# Patient Record
Sex: Male | Born: 1985 | Race: White | Hispanic: No | Marital: Single | Smoking: Former smoker
Health system: Southern US, Community
[De-identification: ages and names within clinical notes are randomized; demographics above are authoritative.]

## PROBLEM LIST (undated history)

## (undated) DIAGNOSIS — J039 Acute tonsillitis, unspecified: Secondary | ICD-10-CM

## (undated) DIAGNOSIS — F909 Attention-deficit hyperactivity disorder, unspecified type: Secondary | ICD-10-CM

## (undated) HISTORY — DX: Attention-deficit hyperactivity disorder, unspecified type: F90.9

---
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
## 2011-09-03 ENCOUNTER — Emergency Department: Payer: Self-pay | Admitting: Emergency Medicine

## 2012-09-04 DIAGNOSIS — J039 Acute tonsillitis, unspecified: Secondary | ICD-10-CM

## 2012-09-04 HISTORY — DX: Acute tonsillitis, unspecified: J03.90

## 2014-04-26 ENCOUNTER — Emergency Department (HOSPITAL_COMMUNITY)
Admission: EM | Admit: 2014-04-26 | Discharge: 2014-04-26 | Disposition: A | Discharge status: Home / Self Care | Payer: Medicaid Other | Attending: Emergency Medicine | Admitting: Emergency Medicine

## 2014-04-26 ENCOUNTER — Emergency Department (HOSPITAL_COMMUNITY): Payer: Medicaid Other

## 2014-04-26 ENCOUNTER — Encounter (HOSPITAL_COMMUNITY): Payer: Self-pay | Admitting: Emergency Medicine

## 2014-04-26 DIAGNOSIS — R51 Headache: Secondary | ICD-10-CM | POA: Insufficient documentation

## 2014-04-26 DIAGNOSIS — J029 Acute pharyngitis, unspecified: Secondary | ICD-10-CM | POA: Insufficient documentation

## 2014-04-26 DIAGNOSIS — J039 Acute tonsillitis, unspecified: Secondary | ICD-10-CM | POA: Insufficient documentation

## 2014-04-26 HISTORY — DX: Acute tonsillitis, unspecified: J03.90

## 2014-04-26 LAB — RAPID STREP SCREEN (MED CTR MEBANE ONLY): Streptococcus, Group A Screen (Direct): NEGATIVE

## 2014-04-26 MED ORDER — ACETAMINOPHEN-CODEINE #3 300-30 MG PO TABS: 1.0000 | ORAL_TABLET | Freq: Four times a day (QID) | ORAL | Status: DC | PRN

## 2014-04-26 MED ORDER — CLINDAMYCIN HCL 150 MG PO CAPS
600.0000 mg | ORAL_CAPSULE | Freq: Once | ORAL | Status: AC
  Administered 2014-04-26: 600 mg via ORAL
  Filled 2014-04-26: qty 4

## 2014-04-26 MED ORDER — IOHEXOL 300 MG/ML  SOLN
75.0000 mL | Freq: Once | INTRAMUSCULAR | Status: AC | PRN
  Administered 2014-04-26: 75 mL via INTRAVENOUS

## 2014-04-26 MED ORDER — DEXAMETHASONE SODIUM PHOSPHATE 10 MG/ML IJ SOLN
10.0000 mg | Freq: Once | INTRAMUSCULAR | Status: AC
  Administered 2014-04-26: 10 mg via INTRAVENOUS
  Filled 2014-04-26: qty 1

## 2014-04-26 MED ORDER — CLINDAMYCIN HCL 150 MG PO CAPS: 450.0000 mg | ORAL_CAPSULE | Freq: Three times a day (TID) | ORAL | Status: DC

## 2014-04-26 MED ORDER — HYDROCODONE-ACETAMINOPHEN 5-325 MG PO TABS
1.0000 | ORAL_TABLET | Freq: Once | ORAL | Status: AC
  Administered 2014-04-26: 1 via ORAL
  Filled 2014-04-26: qty 1

## 2014-04-26 NOTE — Discharge Instructions (Signed)
1. Medications: Clindamycin, Tylenol #3, usual home medications 2. Treatment: rest, drink plenty of fluids, complete antibiotic course 3. Follow Up: Please followup with your primary doctor and ear nose and throat for discussion of your diagnoses and further evaluation after today's visit; if you do not have a primary care doctor use the resource guide provided to find one;     Tonsillitis Tonsillitis is an infection of the throat that causes the tonsils to become red, tender, and swollen. Tonsils are collections of lymphoid tissue at the back of the throat. Each tonsil has crevices (crypts). Tonsils help fight nose and throat infections and keep infection from spreading to other parts of the body for the first 18 months of life.  CAUSES Sudden (acute) tonsillitis is usually caused by infection with streptococcal bacteria. Long-lasting (chronic) tonsillitis occurs when the crypts of the tonsils become filled with pieces of food and bacteria, which makes it easy for the tonsils to become repeatedly infected. SYMPTOMS  Symptoms of tonsillitis include:  A sore throat, with possible difficulty swallowing.  White patches on the tonsils.  Fever.  Tiredness.  New episodes of snoring during sleep, when you did not snore before.  Small, foul-smelling, yellowish-white pieces of material (tonsilloliths) that you occasionally cough up or spit out. The tonsilloliths can also cause you to have bad breath. DIAGNOSIS Tonsillitis can be diagnosed through a physical exam. Diagnosis can be confirmed with the results of lab tests, including a throat culture. TREATMENT  The goals of tonsillitis treatment include the reduction of the severity and duration of symptoms and prevention of associated conditions. Symptoms of tonsillitis can be improved with the use of steroids to reduce the swelling. Tonsillitis caused by bacteria can be treated with antibiotic medicines. Usually, treatment with antibiotic medicines  is started before the cause of the tonsillitis is known. However, if it is determined that the cause is not bacterial, antibiotic medicines will not treat the tonsillitis. If attacks of tonsillitis are severe and frequent, your health care provider may recommend surgery to remove the tonsils (tonsillectomy). HOME CARE INSTRUCTIONS   Rest as much as possible and get plenty of sleep.  Drink plenty of fluids. While the throat is very sore, eat soft foods or liquids, such as sherbet, soups, or instant breakfast drinks.  Eat frozen ice pops.  Gargle with a warm or cold liquid to help soothe the throat. Mix 1/4 teaspoon of salt and 1/4 teaspoon of baking soda in 8 oz of water. SEEK MEDICAL CARE IF:   Large, tender lumps develop in your neck.  A rash develops.  A green, yellow-brown, or bloody substance is coughed up.  You are unable to swallow liquids or food for 24 hours.  You notice that only one of the tonsils is swollen. SEEK IMMEDIATE MEDICAL CARE IF:   You develop any new symptoms such as vomiting, severe headache, stiff neck, chest pain, or trouble breathing or swallowing.  You have severe throat pain along with drooling or voice changes.  You have severe pain, unrelieved with recommended medications.  You are unable to fully open the mouth.  You develop redness, swelling, or severe pain anywhere in the neck.  You have a fever. MAKE SURE YOU:   Understand these instructions.  Will watch your condition.  Will get help right away if you are not doing well or get worse. Document Released: 05/31/2005 Document Revised: 01/05/2014 Document Reviewed: 02/07/2013 [facility] Patient Information 2015 [place], [place]. This information is not intended to replace advice  given to you by your health care provider. Make sure you discuss any questions you have with your health care provider.    Emergency Department Resource Guide 1) Find a Doctor and Pay Out of Pocket Although you won't  have to find out who is covered by your insurance plan, it is a good idea to ask around and get recommendations. You will then need to call the office and see if the doctor you have chosen will accept you as a new patient and what types of options they offer for patients who are self-pay. Some doctors offer discounts or will set up payment plans for their patients who do not have insurance, but you will need to ask so you aren't surprised when you get to your appointment.  2) Contact Your Local Health Department Not all health departments have doctors that can see patients for sick visits, but many do, so it is worth a call to see if yours does. If you don't know where your local health department is, you can check in your phone book. The CDC also has a tool to help you locate your state's health department, and many state websites also have listings of all of their local health departments.  3) Find a Walk-in Clinic If your illness is not likely to be very severe or complicated, you may want to try a walk in clinic. These are popping up all over the country in pharmacies, drugstores, and shopping centers. They're usually staffed by nurse practitioners or physician assistants that have been trained to treat common illnesses and complaints. They're usually fairly quick and inexpensive. However, if you have serious medical issues or chronic medical problems, these are probably not your best option.  No Primary Care Doctor: - Call Health Connect at  [phone] - they can help you locate a primary care doctor that  accepts your insurance, provides certain services, etc. - Physician Referral Service- [phone]  Chronic Pain Problems: Organization         Address  Phone   Notes  [facility]  [phone] Patients need to be referred by their primary care doctor.   Medication Assistance: Organization         Address  Phone   Notes  [facility] Medication [facility]  [address] [place] [place], [place] [address] [phone] --Must be a resident of [facility] -- Must have NO insurance coverage whatsoever (no Medicaid/ Medicare, etc.) -- The pt. MUST have a primary care doctor that directs their care regularly and follows them in the community   [facility]  [phone]   [employer]  [phone]    Agencies that provide inexpensive medical care: Organization         Address  Phone   Notes  [facility]  [phone]   [facility]    [phone]   [facility] [address] [place], [place] [address] [phone]   [facility] [address] [place], [place] [phone]   Planned Parenthood    [phone]   [facility]    [phone]   [facility]  [address], [place] Phone:  [phone], Fax:  [phone] Hours of Operation:  9 am - 6 pm, M-F.  Also accepts Medicaid/Medicare and self-pay.  [facility]  [address], [place] Phone: [phone])  [phone], Fax: [phone]. Hours of Operation:  8:30 am - 5:30 pm, M-F.  Also accepts Medicaid and self-pay.  [facility] [place] [address] [place] Phone: [phone]   [facility] [address] Natasha Bence [place], [place] [phone], Ext. [phone] Mondays & Thursdays: 7-9 AM.  First 15 patients are seen on a first come, first serve basis.    Medicaid-accepting [facility] Department Providers:  Organization         Address  Phone   Notes  [facility] [address], [place] [phone] Also accepts self-pay patients.  [facility] [address] Laurell Josephs [place], [place]  [phone]   [facility] [address], [place] [phone]   [facility] [address] [place] [phone]     Renaye Rakers [address], [place]   [phone] Only accepts [place] Access [place] patients after they have their name applied to their card.   Self-Pay (no insurance) in [facility]:  Organization         Address  Phone   Notes  Sickle Cell Patients, [facility] [address] [place], [place] [phone]   [facility] [address] [place], [place] [phone]   [facility] [place]  [address], [place] [phone]   [facility]/Dr. Osei-Bonsu  [address], [place] or [address], [place] [phone] Phone number for both [place] and [place] locations is the same.  [facility] and [facility] [address] [place] [phone]   [facility] [address] [place] or [address] [phone]   [facility] [address], [place] [phone], phone; [phone], fax Sees patients 1st and 3rd Saturday of every month.  Must not qualify for public or private insurance (i.e. Medicaid, Medicare, [place] Health Choice, Veterans' Benefits)  Household income should be no more than 200% of the poverty level The clinic cannot treat you if you are pregnant or think you are pregnant  Sexually transmitted diseases are not treated at the clinic.    Dental Care: Organization         Address  Phone  Notes  [facility] Department of [facility] [address] [place], [place] [phone] Accepts children up to age 50 who are enrolled in [place] or [place] Health Choice; pregnant women with a Medicaid card; and children who have applied for Medicaid or [place] Health Choice, but were declined, whose parents can pay a reduced fee at time of service.  [facility] of [facility]  [address], [place] [phone] Accepts children  up to age 68 who are enrolled in [place] or [place] Health Choice; pregnant women with a Medicaid card; and children who have applied for Medicaid or  Health Choice, but were declined, whose parents can pay a reduced fee at time of service.  [facility]  [address] [place], [place] [phone] Patients are seen by appointment only. Walk-ins are not accepted. [facility] will see patients 63 years of age and older. Monday - Tuesday (8am-5pm) Most Wednesdays (8:30-5pm) $30 per visit, cash only  [facility]  [address],  [place] [phone] Patients are seen by appointment only. Walk-ins are not accepted. [facility] will see patients 106 years of age and older. One Wednesday Evening (Monthly: Volunteer Based).  $30 per visit, cash only  [employer]  [phone] for adults; Children under age 51, call Graduate Pediatric Dentistry at [phone]. Children aged 17-14, please call [phone] to request a pediatric application.  Dental services are provided in all areas of dental care including fillings, crowns and bridges, complete and partial dentures, implants, gum treatment, root canals, and extractions. Preventive care is also provided. Treatment is provided to both adults and children. Patients are selected via a lottery and there is often a waiting list.   [facility] [address], [place]  [phone] [email or web address]   [facility] [address] [place], [place] [phone], Ext. [phone] Second and Fourth Thursday of each month, opens at 6:30 AM; Clinic ends at 9 AM.  Patients are seen on a first-come first-served basis, and a limited number are seen during each clinic.   [facility]  [address] Ether Griffins [place], [place] [phone]   Eligibility Requirements You must have lived in [place], [place], or [place] counties for at least the last  three months.   You cannot be eligible for state or federal sponsored [place], including [employer], [place], or [employer].   You generally cannot be eligible for healthcare insurance through your employer.    How to apply: Eligibility screenings are held every Tuesday and Wednesday afternoon from 1:00 pm until 4:00 pm. You do not need an appointment for the interview!  [facility] [address], [place], [place] [phone]   [facility] Health Department  [phone]   [facility] Health Department  [phone]   [facility] Health Department  [phone]    Behavioral Health Resources in the Community: Intensive Outpatient Programs Organization         Address  Phone  Notes  [facility] [address] [place], [place] [phone]   [facility] Outpatient [address] [place], [place] [phone]   [facility] [address], [place], [place]  [phone]   [facility] Mental Health [address]  [place], [place] [phone] or [phone]   Substance Abuse Resources Organization         Address  Phone  Notes  [facility]  [phone]   [facility]  [phone]   [facility]  [phone]   Floydene Flock  [phone]   Residential & Outpatient Substance Abuse Program  [phone]   Psychological Services Organization         Address  Phone  Notes  [facility] Behavior[facility]  [phone]   [facility]  [phone]   [facility] Mental Health [address] [place] [phone] or [phone]    Mobile Crisis Teams Organization         Address  Phone  Notes  [facility], Mobile Crisis Care Unit  [phone]   [facility]  [address] [place], [place] [phone]   Doristine Locks [address] [place] [phone]     Self-Help/Support Groups Organization         Address  Phone             Notes  [facility] [place] - variety of support  groups  [phone]- [phone] Call for more information  Narcotics Anonymous (NA), [facility] [address], [employer] [place]  2 meetings at this location   Residential Sports administrator         Address  Phone  Notes  [facility] [address],    [place]  [phone]   [facility]  [address], [place] [address], [place], [place] [phone]   [facility] [address] [place], [place] [phone] Admissions: 8am-3pm M-F  [facility] [address]    [place], [place] [phone]   [facility] [address] [place], [place], [place] [phone]   [facility] [address],  [place], [place] [phone]   [facility] - Intensive Outpatient [address] Laurell Joseph[address], [place], [place] [phone]   [facility] ([facility] [address] [place]  [place], [place] [phone] or [phone]   [facility] ([facility]) [address] [place], [place] [phone] Accepts Medicaid  [facility] [place] [address]  [place] [phone] Substance Abuse/Addiction Treatment   [facility] Organization         Address  Phone  Notes  [facility]  [phone]   Angie Fava, PhD [address] Ervin Knack [place], [place]   [phone] or [phone]   [facility] Behavioral   [address] [place], [place] [phone]   [facility] [address], [place], [place] [phone] Insurance/Medicaid/sponsorship through [facility] and Families [address]                                    [place], [place] [phone] Therapy/tele-psych/case  [facility] [address], [place] [phone]    Dr. Lolly Mustache  [phone]   Free  [facility] [place]  [employer] [facility] Dept. 1) [address], [place] 2) [address], [place] 3)  [address], [place] [phone]  [phone]   [facility] Child Abuse Hotline [phone] or [phone] (After Hours)

## 2014-04-26 NOTE — ED Notes (Signed)
He states hes had a sore throat and a headache x 2 days.

## 2014-04-26 NOTE — ED Provider Notes (Signed)
CSN: [id number]     Arrival date & time 04/26/14  6213 History  This chart was scribed for non-physician practitioner Dalia Heading, PA-C, working with Elwin Mocha, MD, by Yevette Edwards, ED Scribe. This patient was seen in room TR07C/TR07C and the patient's care was started at 7:32 PM.   None    Chief Complaint  Patient presents with  . Sore Throat    The history is provided by the patient. No language interpreter was used.   HPI Comments: Clifford Clay is a 28 y.o. male who presents to the Emergency Department complaining of a sore throat which has worsened in the past two days. He endorses a h/o tonsillitis for two years stating his left tonsil is chronically swollen. However, he reports the left tonsil has increased in swelling in the past two days and has been associated with pain. He reports a subjective fever and chills; his temperature in the ED is 98.1 F. He also endorses chronic headaches which he attributes to his wisdom teeth. He has used 800 mg IBU with moderate relief. Mr. Bozman states he was treated for tonsillitis a year ago, but he has not followed up with a ENT.  Past Medical History  Diagnosis Date  . Tonsillitis    History reviewed. No pertinent past surgical history. History reviewed. No pertinent family history. History  Substance Use Topics  . Smoking status: Never Smoker   . Smokeless tobacco: Not on file  . Alcohol Use: Yes     Comment: rare    Review of Systems  Constitutional: Positive for fever and chills. Negative for diaphoresis, appetite change, fatigue and unexpected weight change.  HENT: Positive for sore throat. Negative for mouth sores.   Eyes: Negative for visual disturbance.  Respiratory: Negative for cough, chest tightness, shortness of breath and wheezing.   Cardiovascular: Negative for chest pain.  Gastrointestinal: Negative for nausea, vomiting, abdominal pain, diarrhea and constipation.  Endocrine: Negative for polydipsia,  polyphagia and polyuria.  Genitourinary: Negative for dysuria, urgency, frequency and hematuria.  Musculoskeletal: Negative for back pain and neck stiffness.  Skin: Negative for rash.  Allergic/Immunologic: Negative for immunocompromised state.  Neurological: Positive for headaches. Negative for syncope and light-headedness.  Hematological: Does not bruise/bleed easily.  Psychiatric/Behavioral: Negative for sleep disturbance. The patient is not nervous/anxious.     Allergies  Review of patient's allergies indicates no known allergies.  Home Medications   Prior to Admission medications   Medication Sig Start Date End Date Taking? Authorizing Provider  acetaminophen-codeine (TYLENOL #3) 300-30 MG per tablet Take 1-2 tablets by mouth every 6 (six) hours as needed for moderate pain. 04/26/14   Tawsha Terrero, PA-C  clindamycin (CLEOCIN) 150 MG capsule Take 3 capsules (450 mg total) by mouth 3 (three) times daily. 04/26/14   Tyisha Cressy, PA-C   Triage Vitals: BP 137/83  Pulse 72  Temp(Src) 98.1 F (36.7 C) (Oral)  Resp 16  Ht  (1.778 m)  Wt 210 lb (95.255 kg)  BMI 30.13 kg/m2  SpO2 99%  Physical Exam  Nursing note and vitals reviewed. Constitutional: He appears well-developed and well-nourished. No distress.  HENT:  Head: Normocephalic and atraumatic.  Right Ear: Tympanic membrane, external ear and ear canal normal.  Left Ear: Tympanic membrane, external ear and ear canal normal.  Nose: Nose normal. No mucosal edema or rhinorrhea.  Mouth/Throat: Uvula is midline and mucous membranes are normal. Mucous membranes are not dry. No trismus in the jaw. No uvula swelling. Oropharyngeal exudate, posterior  oropharyngeal edema and posterior oropharyngeal erythema present. No tonsillar abscesses.  Posterior oropharynx with erythema, edema and exudate on the left tonsil alone, right tonsil normal   Eyes: Conjunctivae are normal.  Neck: Normal range of motion, full passive  range of motion without pain and phonation normal. No tracheal tenderness, no spinous process tenderness and no muscular tenderness present. No rigidity. No erythema and normal range of motion present. No Brudzinski's sign and no Kernig's sign noted.  Range of motion without pain no No midline or paraspinal tenderness Normal phonation No stridor Handling secretions without difficulty No nuchal rigidity or meningeal signs  Cardiovascular: Normal rate, regular rhythm and normal heart sounds.   Pulses:      Radial pulses are 2+ on the right side, and 2+ on the left side.  Pulmonary/Chest: Effort normal and breath sounds normal. No stridor. No respiratory distress. He has no decreased breath sounds. He has no wheezes.  Equal chest expansion, clear and equal breath sounds without focal wheezes, rhonchi or rales  Musculoskeletal: Normal range of motion.  Lymphadenopathy:       Head (right side): No submental, no submandibular, no tonsillar, no preauricular, no posterior auricular and no occipital adenopathy present.       Head (left side): Submandibular and tonsillar adenopathy present. No submental, no preauricular, no posterior auricular and no occipital adenopathy present.    He has cervical adenopathy (left superficial).       Right cervical: No superficial cervical, no deep cervical and no posterior cervical adenopathy present.      Left cervical: Superficial cervical adenopathy present. No deep cervical and no posterior cervical adenopathy present.  Neurological: He is alert.  Alert and oriented Moves all extremities without ataxia  Skin: Skin is warm and dry. He is not diaphoretic.  Psychiatric: He has a normal mood and affect.    ED Course  Procedures (including critical care time)  DIAGNOSTIC STUDIES: Oxygen Saturation is 99% on room air, normal by my interpretation.    COORDINATION OF CARE:  7:37 PM- Discussed treatment plan with patient, and the patient agreed to the plan. The  plan includes antibiotics and a CT scan.   7:57 PM- Dr. Gwendolyn Grant assessed the pt and concurred with ordering a CT scan.    Labs Review Labs Reviewed  RAPID STREP SCREEN  CULTURE, GROUP A STREP    Imaging Review Ct Soft Tissue Neck W Contrast  04/26/2014   CLINICAL DATA:  Sore throat  EXAM: CT NECK WITH CONTRAST  TECHNIQUE: Multidetector CT imaging of the neck was performed using the standard protocol following the bolus administration of intravenous contrast.  CONTRAST:  75mL OMNIPAQUE IOHEXOL 300 MG/ML  SOLN  COMPARISON:  None.  FINDINGS: There is tonsillar enlargement, particularly on the left. This tonsillar enlargement on the left is causing localized narrowing of the airway at the tongue base. There is no well-defined abscess in the tonsil/peritonsillar regions.  Varix appears within normal limits. The epiglottis appears normal. The area epiglottic folds are borderline thickened without focal mass or abscess. There is no surrounding edema in this area.  The tongue appears normal.  There are multiple small lymph nodes throughout the neck. There is an enlarged lymph node between the left sublingual gland and sternocleidomastoid muscle, measuring 1.6 x 1.3 cm. There is a second lymph node slightly inferiorly on the left just posterior to the jugular vein and deep to the sternocleidomastoid muscle measuring 1.6 x 1.0 cm. By size criteria, no other lymph node  enlargement is seen.  Thyroid appears normal.  There is no upper mediastinal adenopathy. Visualized lungs are clear.  Salivary glands appear within normal limits bilaterally. There are no bony lesions appreciable on this study. Visualized paranasal sinuses and mastoids appear clear. Visualized brain parenchyma is normal. No vascular lesions are appreciable.  IMPRESSION: Peritonsillar enlargement on the left causing some focal airway narrowing at the tongue base level. There is adenopathy on the left deep to the sternocleidomastoid muscle, likely  secondary to inflammation from the peritonsillar regions. No well-defined abscess.  Borderline thickening of the area epiglottic folds without edema or mass. No abscess.  Multiple smaller lymph nodes are noted in both deep cervical node chains and in the tongue base regions bilaterally.  No bony abnormality. Visualized paranasal sinuses and mastoids appear clear.   Electronically Signed   By: Bretta Bang M.D.   On: 04/26/2014 20:38     EKG Interpretation None      MDM   Final diagnoses:  Tonsillitis   Graeden Bitner presents with a history of chronic tonsillitis with increasing pain and swelling of the left tonsil with purulent pockets noted in the last several days. Concern for possible peritonsillar abscess. We'll obtain CT neck.  9:25 PM CT neck with peritonsillar enlargement on the left with adenopathy but no abscess.  Patient given dose of clindamycin here in the emergency department and will be discharged home with same. Discussed need for close followup with ear nose and throat physician within one week. Also given resources to find primary care physician.  Patient is alert, oriented, nontoxic nonseptic appearing. No systemic symptoms. Patient with patent airway, no stridor and he handling secretions and by mouth without difficulty.  Patient is return to emergency department if he develops high fevers, difficulty breathing or inability to swallow.   BP 132/88  Pulse 62  Temp(Src) 98.7 F (37.1 C) (Oral)  Resp 16  Ht  (1.778 m)  Wt 210 lb (95.255 kg)  BMI 30.13 kg/m2  SpO2 99%  The patient was discussed with and seen by Dr. Gwendolyn Grant who agrees with the treatment plan.  I personally performed the services described in this documentation, which was scribed in my presence. The recorded information has been reviewed and is accurate.   Dahlia Client Ranell Skibinski, PA-C 04/26/14 2127  Dierdre Forth, PA-C 04/26/14 2127

## 2014-04-28 LAB — CULTURE, GROUP A STREP

## 2014-04-30 NOTE — ED Provider Notes (Signed)
Medical screening examination/treatment/procedure(s) were conducted as a shared visit with non-physician practitioner(s) and myself.  I personally evaluated the patient during the encounter.   EKG Interpretation None      Patient with hx of chronic tonsillitis, L swollen tonsil on exam. Sore throat and afebrile here. L tonsil with exudates and moderate swelling. Uvula midline. CT scan without abscess. Abx, discharge.  Elwin Mocha, MD 04/30/14 [id number]

## 2014-05-01 ENCOUNTER — Ambulatory Visit: Payer: Self-pay | Attending: Family Medicine | Admitting: Family Medicine

## 2014-05-01 ENCOUNTER — Encounter: Payer: Self-pay | Admitting: Family Medicine

## 2014-05-01 VITALS — BP 113/78 | HR 73 | Temp 98.1°F | Resp 16 | Ht 70.5 in | Wt 219.0 lb

## 2014-05-01 DIAGNOSIS — Z113 Encounter for screening for infections with a predominantly sexual mode of transmission: Secondary | ICD-10-CM

## 2014-05-01 DIAGNOSIS — F17200 Nicotine dependence, unspecified, uncomplicated: Secondary | ICD-10-CM | POA: Insufficient documentation

## 2014-05-01 DIAGNOSIS — Z1159 Encounter for screening for other viral diseases: Secondary | ICD-10-CM | POA: Insufficient documentation

## 2014-05-01 DIAGNOSIS — F17210 Nicotine dependence, cigarettes, uncomplicated: Secondary | ICD-10-CM

## 2014-05-01 DIAGNOSIS — Z833 Family history of diabetes mellitus: Secondary | ICD-10-CM | POA: Insufficient documentation

## 2014-05-01 DIAGNOSIS — J039 Acute tonsillitis, unspecified: Secondary | ICD-10-CM | POA: Insufficient documentation

## 2014-05-01 LAB — POCT GLYCOSYLATED HEMOGLOBIN (HGB A1C): Hemoglobin A1C: 5.3

## 2014-05-01 NOTE — Patient Instructions (Signed)
Clifford Clay,  Thank you for coming in today. It was a pleasure meeting and talking with you. I look forward to being your primary doctor.   Regarding your recurrent tonsillitis over the past year, and his referral to ENT. He should fill the paperwork for financial assistance. In the meantime failure prescription for clindamycin and Tylenol No. 3 to have as needed. If he had recurrence of those that are severe associated with fever please schedule an appointment for followup with me.  Remember to come next month for a flu shot.  You will be called with the results of your screening study.  Dr. Armen Pickup

## 2014-05-01 NOTE — Assessment & Plan Note (Signed)
A: recurrent tonsillitis on the L sided for the past year. Most likely viral.  P: Referral to ENT Patient to fill rx for clinda and tylenol #3 to have as needed

## 2014-05-01 NOTE — Assessment & Plan Note (Signed)
Screening HIV  

## 2014-05-01 NOTE — Progress Notes (Signed)
Establish Care HFU, Hx tonsillitis pt stated did not stat antibiotic

## 2014-05-01 NOTE — Progress Notes (Signed)
   Subjective:    Patient ID: Clifford Clay, male    DOB: 04-20-1986, 28 y.o.   MRN: [id number] CC: establish care, ED f/u for sore throat  HPI 28 year old male presents to establish care the following:  #1 recurrent tonsillitis: Patient with one-year recurrent tonsillitis is worse on the left side. He reports that he'll intermittently have subjective fevers, sore throat, hoarseness along with swelling and pus formation on his tonsils. The symptoms occur every 4-6 weeks and resolve on their own over the course of 7-10 days. Most recently his symptoms were severe he went to the emergency department on 04/26/14. Negative rapid strep and strep culture CT neck w/o abscess. Since being seen in the ED if had improvement of pain and swelling. He has no recurrent subjective fevers.  Soc hx: THC user, occasional tobacco Review of Systems As per history of present illness    Objective:   Physical Exam BP 113/78  Pulse 73  Temp(Src) 98.1 F (36.7 C) (Oral)  Resp 16  Ht 5' 10.5" (1.791 m)  Wt 219 lb (99.338 kg)  BMI 30.97 kg/m2  SpO2 98% General appearance: alert, cooperative and no distress Eyes: conjunctivae/corneas clear. PERRL, EOM's intact.  Ears: normal TM's and external ear canals both ears Nose: Nares normal. Septum midline. Mucosa normal. No drainage or sinus tenderness. Throat: normal findings: lips normal without lesions, buccal mucosa normal, gums healthy, teeth intact, non-carious, tongue midline and normal and oropharynx pink & moist without lesions or evidence of thrush and abnormal findings: tonsillar hypertrophy asymmetric on the L with multiple crypts  Lungs: clear to auscultation bilaterally Heart: regular rate and rhythm, S1, S2 normal, no murmur, click, rub or gallop   CT neck 04/26/14: Peritonsillar enlargement on the left causing some focal airway  narrowing at the tongue base level. There is adenopathy on the left  deep to the sternocleidomastoid muscle, likely secondary  to  inflammation from the peritonsillar regions. No well-defined  abscess.  Borderline thickening of the area epiglottic folds without edema or  mass. No abscess.  Multiple smaller lymph nodes are noted in both deep cervical node  chains and in the tongue base regions bilaterally.  No bony abnormality. Visualized paranasal sinuses and mastoids  appear clear.     Assessment & Plan:

## 2014-05-01 NOTE — Assessment & Plan Note (Addendum)
Screening A1c   Screening A1c normal.

## 2014-05-02 LAB — HIV ANTIBODY (ROUTINE TESTING W REFLEX): HIV 1&2 Ab, 4th Generation: NONREACTIVE

## 2014-05-05 ENCOUNTER — Telehealth: Payer: Self-pay | Admitting: Family Medicine

## 2014-05-05 ENCOUNTER — Telehealth: Payer: Self-pay | Admitting: *Deleted

## 2014-05-05 NOTE — Telephone Encounter (Signed)
Left voice massage to return call,

## 2014-05-05 NOTE — Telephone Encounter (Signed)
Pt returning call regarding results. Please f/u with pt. °

## 2014-05-05 NOTE — Telephone Encounter (Signed)
Message copied by Dyann Kief on Tue May 05, 2014  9:15 AM ------      Message from: Dessa Phi      Created: Mon May 04, 2014  5:51 PM       Please call patient.      HIV negative ------

## 2014-05-05 NOTE — Telephone Encounter (Signed)
Left message on VM that labs were normal and to return my call if any questions.

## 2014-05-26 ENCOUNTER — Ambulatory Visit: Payer: Self-pay

## 2014-06-29 ENCOUNTER — Ambulatory Visit: Payer: Self-pay | Attending: Family Medicine | Admitting: Family Medicine

## 2014-06-29 ENCOUNTER — Encounter: Payer: Self-pay | Admitting: Family Medicine

## 2014-06-29 VITALS — BP 100/60 | HR 57 | Temp 98.3°F | Resp 18 | Ht 70.0 in | Wt 217.0 lb

## 2014-06-29 DIAGNOSIS — J039 Acute tonsillitis, unspecified: Secondary | ICD-10-CM

## 2014-06-29 DIAGNOSIS — F901 Attention-deficit hyperactivity disorder, predominantly hyperactive type: Secondary | ICD-10-CM

## 2014-06-29 DIAGNOSIS — K625 Hemorrhage of anus and rectum: Secondary | ICD-10-CM

## 2014-06-29 DIAGNOSIS — J0391 Acute recurrent tonsillitis, unspecified: Secondary | ICD-10-CM | POA: Insufficient documentation

## 2014-06-29 DIAGNOSIS — F909 Attention-deficit hyperactivity disorder, unspecified type: Secondary | ICD-10-CM | POA: Insufficient documentation

## 2014-06-29 LAB — HEMOCCULT GUIAC POC 1CARD (OFFICE): FECAL OCCULT BLD: NEGATIVE

## 2014-06-29 NOTE — Progress Notes (Signed)
   Subjective:    Patient ID: Clifford Clay, male    DOB: 01/05/1986, 28 y.o.   MRN: [id number] CC: ADHD  HPI 28 yo M with reported history of ADHD dx at age 89. Reports lack of meaningful relationships outside of family members. Fired from 16 jobs in his life. Had particular difficulty in completing task. Has difficulty focusing on task and completing task. Difficulty remembering instructions. Took a friends prescription adderal x one and improved focused. Admits to some depressed mood. Admits to ETOH and THC use 3-4 days a week.   2. Recurrent tonsillitis: patient denies sore throat currently. Admits to symptoms of L sided soreness and mild exudate last week. Took a few leftover clindamycin. Symptoms resolved. He denies fever. Smoking is a trigger so he decreases smoking when he has symptoms.   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    Review of Systems As per HPI  PHQ-2; negative     Objective:   Physical Exam BP 100/60  Pulse 57  Temp(Src) 98.3 F (36.8 C) (Oral)  Resp 18  Ht 5\' 10"  (1.778 m)  Wt 217 lb (98.431 kg)  BMI 31.14 kg/m2  SpO2 99% General appearance: alert, cooperative and no distress Psychology: speaks in complete sentences, thoughts complete  Throat: mild swelling L tonsils, no exudate       Assessment & Plan:

## 2014-06-29 NOTE — Assessment & Plan Note (Signed)
A: hx of ADHD per report P: List of psychologist provided Patient will need assessment prior to initiating medication therapy.

## 2014-06-29 NOTE — Progress Notes (Signed)
Complaining of sore throat Stated has Hx ADD, not taking medication

## 2014-06-29 NOTE — Assessment & Plan Note (Signed)
A: recurrent tonsillitis P:  Treat prn Patient advised to call in for symptoms and I will treat based on that

## 2014-12-31 ENCOUNTER — Emergency Department (HOSPITAL_COMMUNITY): Payer: Medicaid Other

## 2014-12-31 ENCOUNTER — Encounter (HOSPITAL_COMMUNITY): Payer: Self-pay | Admitting: Emergency Medicine

## 2014-12-31 ENCOUNTER — Emergency Department (HOSPITAL_COMMUNITY)
Admission: EM | Admit: 2014-12-31 | Discharge: 2014-12-31 | Disposition: A | Discharge status: Home / Self Care | Payer: Self-pay | Attending: Emergency Medicine | Admitting: Emergency Medicine

## 2014-12-31 DIAGNOSIS — S01511A Laceration without foreign body of lip, initial encounter: Secondary | ICD-10-CM | POA: Insufficient documentation

## 2014-12-31 DIAGNOSIS — H05231 Hemorrhage of right orbit: Secondary | ICD-10-CM

## 2014-12-31 DIAGNOSIS — Z792 Long term (current) use of antibiotics: Secondary | ICD-10-CM | POA: Insufficient documentation

## 2014-12-31 DIAGNOSIS — Y939 Activity, unspecified: Secondary | ICD-10-CM | POA: Insufficient documentation

## 2014-12-31 DIAGNOSIS — Y929 Unspecified place or not applicable: Secondary | ICD-10-CM | POA: Insufficient documentation

## 2014-12-31 DIAGNOSIS — R42 Dizziness and giddiness: Secondary | ICD-10-CM | POA: Insufficient documentation

## 2014-12-31 DIAGNOSIS — H1131 Conjunctival hemorrhage, right eye: Secondary | ICD-10-CM | POA: Insufficient documentation

## 2014-12-31 DIAGNOSIS — S0990XA Unspecified injury of head, initial encounter: Secondary | ICD-10-CM | POA: Insufficient documentation

## 2014-12-31 DIAGNOSIS — Z8659 Personal history of other mental and behavioral disorders: Secondary | ICD-10-CM | POA: Insufficient documentation

## 2014-12-31 DIAGNOSIS — S022XXA Fracture of nasal bones, initial encounter for closed fracture: Secondary | ICD-10-CM | POA: Insufficient documentation

## 2014-12-31 DIAGNOSIS — Y999 Unspecified external cause status: Secondary | ICD-10-CM | POA: Insufficient documentation

## 2014-12-31 DIAGNOSIS — Z87891 Personal history of nicotine dependence: Secondary | ICD-10-CM | POA: Insufficient documentation

## 2014-12-31 DIAGNOSIS — R040 Epistaxis: Secondary | ICD-10-CM | POA: Insufficient documentation

## 2014-12-31 DIAGNOSIS — S025XXA Fracture of tooth (traumatic), initial encounter for closed fracture: Secondary | ICD-10-CM | POA: Insufficient documentation

## 2014-12-31 LAB — CBC WITH DIFFERENTIAL/PLATELET
Basophils Absolute: 0 10*3/uL (ref 0.0–0.1)
Basophils Relative: 0 % (ref 0–1)
EOS PCT: 0 % (ref 0–5)
Eosinophils Absolute: 0.1 10*3/uL (ref 0.0–0.7)
HEMATOCRIT: 46.1 % (ref 39.0–52.0)
Hemoglobin: 15.8 g/dL (ref 13.0–17.0)
Lymphocytes Relative: 11 % — ABNORMAL LOW (ref 12–46)
Lymphs Abs: 1.7 10*3/uL (ref 0.7–4.0)
MCH: 29.6 pg (ref 26.0–34.0)
MCHC: 34.3 g/dL (ref 30.0–36.0)
MCV: 86.5 fL (ref 78.0–100.0)
MONO ABS: 0.8 10*3/uL (ref 0.1–1.0)
Monocytes Relative: 5 % (ref 3–12)
Neutro Abs: 13.6 10*3/uL — ABNORMAL HIGH (ref 1.7–7.7)
Neutrophils Relative %: 84 % — ABNORMAL HIGH (ref 43–77)
PLATELETS: 372 10*3/uL (ref 150–400)
RBC: 5.33 MIL/uL (ref 4.22–5.81)
RDW: 12.5 % (ref 11.5–15.5)
WBC: 16.1 10*3/uL — ABNORMAL HIGH (ref 4.0–10.5)

## 2014-12-31 LAB — I-STAT CHEM 8, ED
BUN: 6 mg/dL (ref 6–23)
CHLORIDE: 103 mmol/L (ref 96–112)
Calcium, Ion: 1.14 mmol/L (ref 1.12–1.23)
Creatinine, Ser: 1.2 mg/dL (ref 0.50–1.35)
Glucose, Bld: 103 mg/dL — ABNORMAL HIGH (ref 70–99)
HEMATOCRIT: 50 % (ref 39.0–52.0)
HEMOGLOBIN: 17 g/dL (ref 13.0–17.0)
POTASSIUM: 4.2 mmol/L (ref 3.5–5.1)
SODIUM: 141 mmol/L (ref 135–145)
TCO2: 23 mmol/L (ref 0–100)

## 2014-12-31 LAB — RAPID URINE DRUG SCREEN, HOSP PERFORMED
AMPHETAMINES: NOT DETECTED
BENZODIAZEPINES: POSITIVE — AB
Barbiturates: NOT DETECTED
Cocaine: POSITIVE — AB
OPIATES: NOT DETECTED
Tetrahydrocannabinol: POSITIVE — AB

## 2014-12-31 LAB — ETHANOL: ALCOHOL ETHYL (B): 18 mg/dL — AB (ref 0–9)

## 2014-12-31 MED ORDER — TETANUS-DIPHTH-ACELL PERTUSSIS 5-2.5-18.5 LF-MCG/0.5 IM SUSP
0.5000 mL | Freq: Once | INTRAMUSCULAR | Status: AC
  Administered 2014-12-31: 0.5 mL via INTRAMUSCULAR
  Filled 2014-12-31: qty 0.5

## 2014-12-31 MED ORDER — LIDOCAINE HCL 2 % IJ SOLN
10.0000 mL | Freq: Once | INTRAMUSCULAR | Status: AC
  Administered 2014-12-31: 200 mg via INTRADERMAL
  Filled 2014-12-31: qty 20

## 2014-12-31 MED ORDER — TRAMADOL HCL 50 MG PO TABS
50.0000 mg | ORAL_TABLET | Freq: Four times a day (QID) | ORAL | Status: DC | PRN
Start: 2014-12-31 — End: 2015-11-18

## 2014-12-31 MED ORDER — NAPROXEN 500 MG PO TABS: 500.0000 mg | ORAL_TABLET | Freq: Two times a day (BID) | ORAL | Status: DC

## 2014-12-31 MED ORDER — TRAMADOL HCL 50 MG PO TABS
50.0000 mg | ORAL_TABLET | Freq: Once | ORAL | Status: AC
  Administered 2014-12-31: 50 mg via ORAL
  Filled 2014-12-31: qty 1
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
## 2014-12-31 NOTE — ED Notes (Signed)
PT monitored by pulse ox, bp cuff, and 12-lead. 
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
## 2014-12-31 NOTE — ED Notes (Signed)
Pt ambulated down the hall independently 40 plus feet.

## 2014-12-31 NOTE — ED Notes (Addendum)
[employer] at the bedside 

## 2014-12-31 NOTE — Discharge Instructions (Signed)
Naprosyn and tramadol for pain. Rinse your mouth with salt water several times a day especially after eating. Outer suture removal on the lower lip in 5 days. Your inner sutures are dissolvable.  Watch for signs of infection. Ice your face several times a day. We updated your tetanus today. Follow up with a dentist as soon as able for treatment of your dental fracture, see referral above.    Concussion A concussion, or closed-head injury, is a brain injury caused by a direct blow to the head or by a quick and sudden movement (jolt) of the head or neck. Concussions are usually not life-threatening. Even so, the effects of a concussion can be serious. If you have had a concussion before, you are more likely to experience concussion-like symptoms after a direct blow to the head.  CAUSES  Direct blow to the head, such as from running into another player during a soccer game, being hit in a fight, or hitting your head on a hard surface.  A jolt of the head or neck that causes the brain to move back and forth inside the skull, such as in a car crash. SIGNS AND SYMPTOMS The signs of a concussion can be hard to notice. Early on, they may be missed by you, family members, and health care providers. You may look fine but act or feel differently. Symptoms are usually temporary, but they may last for days, weeks, or even longer. Some symptoms may appear right away while others may not show up for hours or days. Every head injury is different. Symptoms include:  Mild to moderate headaches that will not go away.  A feeling of pressure inside your head.  Having more trouble than usual:  Learning or remembering things you have heard.  Answering questions.  Paying attention or concentrating.  Organizing daily tasks.  Making decisions and solving problems.  Slowness in thinking, acting or reacting, speaking, or reading.  Getting lost or being easily confused.  Feeling tired all the time or lacking  energy (fatigued).  Feeling drowsy.  Sleep disturbances.  Sleeping more than usual.  Sleeping less than usual.  Trouble falling asleep.  Trouble sleeping (insomnia).  Loss of balance or feeling lightheaded or dizzy.  Nausea or vomiting.  Numbness or tingling.  Increased sensitivity to:  Sounds.  Lights.  Distractions.  Vision problems or eyes that tire easily.  Diminished sense of taste or smell.  Ringing in the ears.  Mood changes such as feeling sad or anxious.  Becoming easily irritated or angry for little or no reason.  Lack of motivation.  Seeing or hearing things other people do not see or hear (hallucinations). DIAGNOSIS Your health care provider can usually diagnose a concussion based on a description of your injury and symptoms. He or she will ask whether you passed out (lost consciousness) and whether you are having trouble remembering events that happened right before and during your injury. Your evaluation might include:  A brain scan to look for signs of injury to the brain. Even if the test shows no injury, you may still have a concussion.  Blood tests to be sure other problems are not present. TREATMENT  Concussions are usually treated in an emergency department, in urgent care, or at a clinic. You may need to stay in the hospital overnight for further treatment.  Tell your health care provider if you are taking any medicines, including prescription medicines, over-the-counter medicines, and natural remedies. Some medicines, such as blood thinners (anticoagulants)  and aspirin, may increase the chance of complications. Also tell your health care provider whether you have had alcohol or are taking illegal drugs. This information may affect treatment.  Your health care provider will send you home with important instructions to follow.  How fast you will recover from a concussion depends on many factors. These factors include how severe your  concussion is, what part of your brain was injured, your age, and how healthy you were before the concussion.  Most people with mild injuries recover fully. Recovery can take time. In general, recovery is slower in older persons. Also, persons who have had a concussion in the past or have other medical problems may find that it takes longer to recover from their current injury. HOME CARE INSTRUCTIONS General Instructions  Carefully follow the directions your health care provider gave you.  Only take over-the-counter or prescription medicines for pain, discomfort, or fever as directed by your health care provider.  Take only those medicines that your health care provider has approved.  Do not drink alcohol until your health care provider says you are well enough to do so. Alcohol and certain other drugs may slow your recovery and can put you at risk of further injury.  If it is harder than usual to remember things, write them down.  If you are easily distracted, try to do one thing at a time. For example, do not try to watch TV while fixing dinner.  Talk with family members or close friends when making important decisions.  Keep all follow-up appointments. Repeated evaluation of your symptoms is recommended for your recovery.  Watch your symptoms and tell others to do the same. Complications sometimes occur after a concussion. Older adults with a brain injury may have a higher risk of serious complications, such as a blood clot on the brain.  Tell your teachers, school nurse, school counselor, coach, athletic trainer, or work Production designer, theatre/television/film about your injury, symptoms, and restrictions. Tell them about what you can or cannot do. They should watch for:  Increased problems with attention or concentration.  Increased difficulty remembering or learning new information.  Increased time needed to complete tasks or assignments.  Increased irritability or decreased ability to cope with  stress.  Increased symptoms.  Rest. Rest helps the brain to heal. Make sure you:  Get plenty of sleep at night. Avoid staying up late at night.  Keep the same bedtime hours on weekends and weekdays.  Rest during the day. Take daytime naps or rest breaks when you feel tired.  Limit activities that require a lot of thought or concentration. These include:  Doing homework or job-related work.  Watching TV.  Working on the computer.  Avoid any situation where there is potential for another head injury (football, hockey, soccer, basketball, martial arts, downhill snow sports and horseback riding). Your condition will get worse every time you experience a concussion. You should avoid these activities until you are evaluated by the appropriate follow-up health care providers. Returning To Your Regular Activities You will need to return to your normal activities slowly, not all at once. You must give your body and brain enough time for recovery.  Do not return to sports or other athletic activities until your health care provider tells you it is safe to do so.  Ask your health care provider when you can drive, ride a bicycle, or operate heavy machinery. Your ability to react may be slower after a brain injury. Never do these activities if  you are dizzy.  Ask your health care provider about when you can return to work or school. Preventing Another Concussion It is very important to avoid another brain injury, especially before you have recovered. In rare cases, another injury can lead to permanent brain damage, brain swelling, or death. The risk of this is greatest during the first 7-10 days after a head injury. Avoid injuries by:  Wearing a seat belt when riding in a car.  Drinking alcohol only in moderation.  Wearing a helmet when biking, skiing, skateboarding, skating, or doing similar activities.  Avoiding activities that could lead to a second concussion, such as contact or  recreational sports, until your health care provider says it is okay.  Taking safety measures in your home.  Remove clutter and tripping hazards from floors and stairways.  Use grab bars in bathrooms and handrails by stairs.  Place non-slip mats on floors and in bathtubs.  Improve lighting in dim areas. SEEK MEDICAL CARE IF:  You have increased problems paying attention or concentrating.  You have increased difficulty remembering or learning new information.  You need more time to complete tasks or assignments than before.  You have increased irritability or decreased ability to cope with stress.  You have more symptoms than before. Seek medical care if you have any of the following symptoms for more than 2 weeks after your injury:  Lasting (chronic) headaches.  Dizziness or balance problems.  Nausea.  Vision problems.  Increased sensitivity to noise or light.  Depression or mood swings.  Anxiety or irritability.  Memory problems.  Difficulty concentrating or paying attention.  Sleep problems.  Feeling tired all the time. SEEK IMMEDIATE MEDICAL CARE IF:  You have severe or worsening headaches. These may be a sign of a blood clot in the brain.  You have weakness (even if only in one hand, leg, or part of the face).  You have numbness.  You have decreased coordination.  You vomit repeatedly.  You have increased sleepiness.  One pupil is larger than the other.  You have convulsions.  You have slurred speech.  You have increased confusion. This may be a sign of a blood clot in the brain.  You have increased restlessness, agitation, or irritability.  You are unable to recognize people or places.  You have neck pain.  It is difficult to wake you up.  You have unusual behavior changes.  You lose consciousness. MAKE SURE YOU:  Understand these instructions.  Will watch your condition.  Will get help right away if you are not doing well or  get worse. Document Released: 11/11/2003 Document Revised: 08/26/2013 Document Reviewed: 03/13/2013 [facility] Patient Information 2015 [place], [place]. This information is not intended to replace advice given to you by your health care provider. Make sure you discuss any questions you have with your health care provider. Facial Laceration  A facial laceration is a cut on the face. These injuries can be painful and cause bleeding. Lacerations usually heal quickly, but they need special care to reduce scarring. DIAGNOSIS  Your health care provider will take a medical history, ask for details about how the injury occurred, and examine the wound to determine how deep the cut is. TREATMENT  Some facial lacerations may not require closure. Others may not be able to be closed because of an increased risk of infection. The risk of infection and the chance for successful closure will depend on various factors, including the amount of time since the injury occurred.  The wound may be cleaned to help prevent infection. If closure is appropriate, pain medicines may be given if needed. Your health care provider will use stitches (sutures), wound glue (adhesive), or skin adhesive strips to repair the laceration. These tools bring the skin edges together to allow for faster healing and a better cosmetic outcome. If needed, you may also be given a tetanus shot. HOME CARE INSTRUCTIONS  Only take over-the-counter or prescription medicines as directed by your health care provider.  Follow your health care provider's instructions for wound care. These instructions will vary depending on the technique used for closing the wound. For Sutures:  Keep the wound clean and dry.   If you were given a bandage (dressing), you should change it at least once a day. Also change the dressing if it becomes wet or dirty, or as directed by your health care provider.   Wash the wound with soap and water 2 times a day. Rinse the  wound off with water to remove all soap. Pat the wound dry with a clean towel.   After cleaning, apply a thin layer of the antibiotic ointment recommended by your health care provider. This will help prevent infection and keep the dressing from sticking.   You may shower as usual after the first 24 hours. Do not soak the wound in water until the sutures are removed.   Get your sutures removed as directed by your health care provider. With facial lacerations, sutures should usually be taken out after 4-5 days to avoid stitch marks.   Wait a few days after your sutures are removed before applying any makeup. For Skin Adhesive Strips:  Keep the wound clean and dry.   Do not get the skin adhesive strips wet. You may bathe carefully, using caution to keep the wound dry.   If the wound gets wet, pat it dry with a clean towel.   Skin adhesive strips will fall off on their own. You may trim the strips as the wound heals. Do not remove skin adhesive strips that are still stuck to the wound. They will fall off in time.  For Wound Adhesive:  You may briefly wet your wound in the shower or bath. Do not soak or scrub the wound. Do not swim. Avoid periods of heavy sweating until the skin adhesive has fallen off on its own. After showering or bathing, gently pat the wound dry with a clean towel.   Do not apply liquid medicine, cream medicine, ointment medicine, or makeup to your wound while the skin adhesive is in place. This may loosen the film before your wound is healed.   If a dressing is placed over the wound, be careful not to apply tape directly over the skin adhesive. This may cause the adhesive to be pulled off before the wound is healed.   Avoid prolonged exposure to sunlight or tanning lamps while the skin adhesive is in place.  The skin adhesive will usually remain in place for 5-10 days, then naturally fall off the skin. Do not pick at the adhesive film.  After Healing: Once  the wound has healed, cover the wound with sunscreen during the day for 1 full year. This can help minimize scarring. Exposure to ultraviolet light in the first year will darken the scar. It can take 1-2 years for the scar to lose its redness and to heal completely.  SEEK IMMEDIATE MEDICAL CARE IF:  You have redness, pain, or swelling around the wound.  You see ayellowish-white fluid (pus) coming from the wound.   You have chills or a fever.  MAKE SURE YOU:  Understand these instructions.  Will watch your condition.  Will get help right away if you are not doing well or get worse. Document Released: 09/28/2004 Document Revised: 06/11/2013 Document Reviewed: 04/03/2013 [facility] Patient Information 2015 [place], [place]. This information is not intended to replace advice given to you by your health care provider. Make sure you discuss any questions you have with your health care provider.

## 2015-01-07 ENCOUNTER — Emergency Department (HOSPITAL_COMMUNITY)
Admission: EM | Admit: 2015-01-07 | Discharge: 2015-01-07 | Disposition: A | Discharge status: Home / Self Care | Payer: Medicaid Other | Attending: Emergency Medicine | Admitting: Emergency Medicine

## 2015-01-07 ENCOUNTER — Encounter (HOSPITAL_COMMUNITY): Payer: Self-pay | Admitting: Emergency Medicine

## 2015-01-07 DIAGNOSIS — Z791 Long term (current) use of non-steroidal anti-inflammatories (NSAID): Secondary | ICD-10-CM | POA: Insufficient documentation

## 2015-01-07 DIAGNOSIS — Z8709 Personal history of other diseases of the respiratory system: Secondary | ICD-10-CM | POA: Insufficient documentation

## 2015-01-07 DIAGNOSIS — Z87891 Personal history of nicotine dependence: Secondary | ICD-10-CM | POA: Insufficient documentation

## 2015-01-07 DIAGNOSIS — Z8659 Personal history of other mental and behavioral disorders: Secondary | ICD-10-CM | POA: Insufficient documentation

## 2015-01-07 DIAGNOSIS — Z792 Long term (current) use of antibiotics: Secondary | ICD-10-CM | POA: Insufficient documentation

## 2015-01-07 DIAGNOSIS — Z79899 Other long term (current) drug therapy: Secondary | ICD-10-CM | POA: Insufficient documentation

## 2015-01-07 DIAGNOSIS — Z4802 Encounter for removal of sutures: Secondary | ICD-10-CM

## 2015-01-07 NOTE — ED Notes (Signed)
Patient states stitches to lower lip and inside bottom lip last Thursday.   Patient no problems with the stitches.   Patient states has been washing mouth out with peroxide and listerine to make sure the inside of mouth doesn't get infected.
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
## 2015-01-07 NOTE — Discharge Instructions (Signed)
Suture Removal, Care After Refer to this sheet in the next few weeks. These instructions provide you with information on caring for yourself after your procedure. Your health care provider may also give you more specific instructions. Your treatment has been planned according to current medical practices, but problems sometimes occur. Call your health care provider if you have any problems or questions after your procedure. WHAT TO EXPECT AFTER THE PROCEDURE After your stitches (sutures) are removed, it is typical to have the following:  Some discomfort and swelling in the wound area.  Slight redness in the area. HOME CARE INSTRUCTIONS   If you have skin adhesive strips over the wound area, do not take the strips off. They will fall off on their own in a few days. If the strips remain in place after 14 days, you may remove them.  Change any bandages (dressings) at least once a day or as directed by your health care provider. If the bandage sticks, soak it off with warm, soapy water.  Apply cream or ointment only as directed by your health care provider. If using cream or ointment, wash the area with soap and water 2 times a day to remove all the cream or ointment. Rinse off the soap and pat the area dry with a clean towel.  Keep the wound area dry and clean. If the bandage becomes wet or dirty, or if it develops a bad smell, change it as soon as possible.  Continue to protect the wound from injury.  Use sunscreen when out in the sun. New scars become sunburned easily. SEEK MEDICAL CARE IF:  You have increasing redness, swelling, or pain in the wound.  You see pus coming from the wound.  You have a fever.  You notice a bad smell coming from the wound or dressing.  Your wound breaks open (edges not staying together). Document Released: 05/16/2001 Document Revised: 06/11/2013 Document Reviewed: 04/02/2013 [facility] Patient Information 2015 [place], [place]. This information is not  intended to replace advice given to you by your health care provider. Make sure you discuss any questions you have with your health care provider.  Please continue to monitor for signs of infection, follow-up as needed

## 2015-03-03 ENCOUNTER — Ambulatory Visit: Payer: Medicaid Other | Attending: Family Medicine

## 2015-09-07 ENCOUNTER — Encounter: Payer: Self-pay | Admitting: Family Medicine

## 2015-09-07 ENCOUNTER — Ambulatory Visit: Payer: Self-pay | Attending: Family Medicine | Admitting: Family Medicine

## 2015-09-07 VITALS — BP 123/78 | HR 82 | Temp 98.4°F | Resp 16 | Ht 70.5 in | Wt 226.0 lb

## 2015-09-07 DIAGNOSIS — R51 Headache: Secondary | ICD-10-CM | POA: Insufficient documentation

## 2015-09-07 DIAGNOSIS — R0982 Postnasal drip: Secondary | ICD-10-CM | POA: Insufficient documentation

## 2015-09-07 DIAGNOSIS — J029 Acute pharyngitis, unspecified: Secondary | ICD-10-CM | POA: Insufficient documentation

## 2015-09-07 DIAGNOSIS — J0120 Acute ethmoidal sinusitis, unspecified: Secondary | ICD-10-CM | POA: Insufficient documentation

## 2015-09-07 LAB — POCT RAPID STREP A (OFFICE): RAPID STREP A SCREEN: NEGATIVE

## 2015-09-07 MED ORDER — AMOXICILLIN 500 MG PO CAPS: 500.0000 mg | ORAL_CAPSULE | Freq: Three times a day (TID) | ORAL | Status: DC

## 2015-09-07 MED FILL — AMOXICILLIN 500 MG CAPSULE: 500 | 10 days supply | Qty: 30 | Fill #0

## 2015-09-07 NOTE — Addendum Note (Signed)
Addended by: Benjamin StainBENNETT-CURSE, [place] L on: 09/07/2015 03:58 PM   Modules accepted: Orders, SmartSet

## 2015-09-07 NOTE — Progress Notes (Signed)
Subjective:  Patient ID: Clifford Clay, male    DOB: 1985-12-25  Age: 30 y.o. MRN: [id number]  CC: Sore Throat   HPI Clifford Clay presents for  an acute visit complaining of a three to four-day history of sore throat , postnasal drip , cough which is productive of greenish sputum , nasal congestion and some sinus pressure.  He denies fever or history of sick contacts but states that he has chronic headaches  He denies any myalgias and has been taking Mucinex with no relief in symptoms.  He is also requesting treatment for ADD which he said he has had as a child and review of his last office note with his PCP reveals he was given a list of psychologist so he could be evaluated and the patient states he went to [facility] were they wanted to prescribe something else for him besides ADD treatment.  Outpatient Prescriptions Prior to Visit  Medication Sig Dispense Refill  . acetaminophen-codeine (TYLENOL #3) 300-30 MG per tablet Take 1-2 tablets by mouth every 6 (six) hours as needed for moderate pain. (Patient not taking: Reported on 09/07/2015) 15 tablet 0  . clindamycin (CLEOCIN) 150 MG capsule Take 3 capsules (450 mg total) by mouth 3 (three) times daily. (Patient not taking: Reported on 09/07/2015) 90 capsule 0  . naproxen (NAPROSYN) 500 MG tablet Take 1 tablet (500 mg total) by mouth 2 (two) times daily. (Patient not taking: Reported on 09/07/2015) 30 tablet 0  . traMADol (ULTRAM) 50 MG tablet Take 1 tablet (50 mg total) by mouth every 6 (six) hours as needed. (Patient not taking: Reported on 09/07/2015) 15 tablet 0   No facility-administered medications prior to visit.    ROS Review of Systems  Constitutional: Negative for activity change and appetite change.  HENT: Positive for sinus pressure and sore throat. Negative for postnasal drip.   Respiratory: Positive for cough. Negative for chest tightness, shortness of breath and wheezing.   Cardiovascular: Negative for chest pain and  palpitations.  Gastrointestinal: Negative for abdominal pain, constipation and abdominal distention.  Genitourinary: Negative.   Musculoskeletal: Negative.   Psychiatric/Behavioral: Negative for behavioral problems and dysphoric mood.    Objective:  BP 123/78 mmHg  Pulse 82  Temp(Src) 98.4 F (36.9 C) (Oral)  Resp 16  Ht 5' 10.5" (1.791 m)  Wt 226 lb (102.513 kg)  BMI 31.96 kg/m2  SpO2 98%  BP/Weight 09/07/2015 01/07/2015 12/31/2014  Systolic BP 123 146 135  Diastolic BP 78 94 82  Wt. (Lbs) 226 218 -  BMI 31.96 30.83 -      Physical Exam  Constitutional: He is oriented to person, place, and time. He appears well-developed and well-nourished.  HENT:  Mouth/Throat: No oropharyngeal exudate.  Mild oropharyngeal erythema  Cardiovascular: Normal rate, normal heart sounds and intact distal pulses.   No murmur heard. Pulmonary/Chest: Effort normal and breath sounds normal. He has no wheezes. He has no rales. He exhibits no tenderness.  Abdominal: Soft. Bowel sounds are normal. He exhibits no distension and no mass. There is no tenderness.  Musculoskeletal: Normal range of motion.  Neurological: He is alert and oriented to person, place, and time.     Assessment & Plan:   1. Acute ethmoidal sinusitis, recurrence not specified Treated - amoxicillin (AMOXIL) 500 MG capsule; Take 1 capsule (500 mg total) by mouth 3 (three) times daily.  Dispense: 30 capsule; Refill: 0  2. Sorethroat - Rapid Strep A   Meds ordered this encounter  Medications  .  amoxicillin (AMOXIL) 500 MG capsule    Sig: Take 1 capsule (500 mg total) by mouth 3 (three) times daily.    Dispense:  30 capsule    Refill:  0    Follow-up: Return in about 1 month (around 10/08/2015), or if symptoms worsen or fail to improve, for  follow-up of ADD with Dr. Armen PickupFunches.   Jaclyn ShaggyEnobong Amao MD

## 2015-09-07 NOTE — Progress Notes (Signed)
Patient's here c/o sore throat x3-4 days.  Patient c/o mucus buildup with green color sputum.  Patient want to address medication for ADD.

## 2015-09-10 LAB — CULTURE, GROUP A STREP

## 2015-10-21 ENCOUNTER — Telehealth: Payer: Self-pay | Admitting: Family Medicine

## 2015-10-21 NOTE — Telephone Encounter (Signed)
Pt. Came in today to sign a release form for his medical records from [facility]. Pt. would like to be notified when his PCP has received his medical records.

## 2015-10-28 ENCOUNTER — Encounter: Payer: Self-pay | Admitting: Family Medicine

## 2015-10-28 NOTE — Progress Notes (Signed)
Patient ID: Clifford Clay, male   DOB: 1985-12-07, 30 y.o.   MRN: [id number] Medical records obtained for [facility] Patient last treated for ADD in 2012 bu Dr. Deatra James. He was treated with startterra 60 mg x 5 months from 5-06/2011 with no improvement in focus. He was started on adderall 10 mg daily in 06/2011, with dose increase to 20 mg daily in 08/2011.  There are no OV notes after 08/2011.

## 2015-11-18 ENCOUNTER — Ambulatory Visit: Payer: Self-pay | Attending: Family Medicine | Admitting: Family Medicine

## 2015-11-18 ENCOUNTER — Encounter: Payer: Self-pay | Admitting: Family Medicine

## 2015-11-18 VITALS — BP 126/76 | HR 68 | Temp 98.2°F | Resp 16 | Ht 70.0 in | Wt 227.0 lb

## 2015-11-18 DIAGNOSIS — F901 Attention-deficit hyperactivity disorder, predominantly hyperactive type: Secondary | ICD-10-CM

## 2015-11-18 DIAGNOSIS — J039 Acute tonsillitis, unspecified: Secondary | ICD-10-CM

## 2015-11-18 DIAGNOSIS — Z87891 Personal history of nicotine dependence: Secondary | ICD-10-CM | POA: Insufficient documentation

## 2015-11-18 DIAGNOSIS — F909 Attention-deficit hyperactivity disorder, unspecified type: Secondary | ICD-10-CM | POA: Insufficient documentation

## 2015-11-18 DIAGNOSIS — Z760 Encounter for issue of repeat prescription: Secondary | ICD-10-CM | POA: Insufficient documentation

## 2015-11-18 DIAGNOSIS — Z79899 Other long term (current) drug therapy: Secondary | ICD-10-CM | POA: Insufficient documentation

## 2015-11-18 MED ORDER — BENZONATATE 100 MG PO CAPS: 100.0000 mg | ORAL_CAPSULE | Freq: Two times a day (BID) | ORAL | Status: AC | PRN

## 2015-11-18 MED ORDER — AMOXICILLIN 500 MG PO CAPS: 500.0000 mg | ORAL_CAPSULE | Freq: Three times a day (TID) | ORAL | Status: AC

## 2015-11-18 NOTE — Assessment & Plan Note (Signed)
A; patient reported ADHD P: [facility]

## 2015-11-18 NOTE — Progress Notes (Addendum)
Subjective:  Patient ID: Clifford Clay, male    DOB: September 26, 1985  Age: 30 y.o. MRN: [id number]  CC: Medication Refill   HPI [name] presents for   1. ADHD: since last OV in 06/2014. he has not had psychological evaluation. I have received medical records from a previous PCP here is a summary of these records: Medical records obtained for [facility] Patient last treated for ADD in 2012 bu Dr. Deatra James. He was treated with startterra 60 mg x 5 months from 5-06/2011 with no improvement in focus. He was started on adderall 10 mg daily in 06/2011, with dose increase to 20 mg daily in 08/2011.  There are no OV notes after 08/2011.   Patient reports trouble focusing. Planning to start school and he is afraid he will not do well due to inability concentrating. He would like to try adderall, initially stating that he has not been tried on adderall until I reviewed his records from 2012 with him. He did not recall the treatment he received in 2012. He denies anxiety and depression.   2. Tonsillitis: this is chronic. He has chronic sore throat and cough. He has excessive cough at times with post-tussive emesis. He denies GERD. He request cough medicine with codeine which he states his grandma has recommended. He denies fever and chills. Denies neck swelling and change in voice.   Social History  Substance Use Topics  . Smoking status: Former Smoker -- 0.10 packs/day    Types: Cigarettes, Cigars  . Smokeless tobacco: Never Used  . Alcohol Use: 0.0 oz/week    0 Standard drinks or equivalent per week     Comment: rare   Outpatient Prescriptions Prior to Visit  Medication Sig Dispense Refill  . acetaminophen-codeine (TYLENOL #3) 300-30 MG per tablet Take 1-2 tablets by mouth every 6 (six) hours as needed for moderate pain. (Patient not taking: Reported on 09/07/2015) 15 tablet 0  . amoxicillin (AMOXIL) 500 MG capsule Take 1 capsule (500 mg total) by mouth 3 (three) times daily. 30  capsule 0  . clindamycin (CLEOCIN) 150 MG capsule Take 3 capsules (450 mg total) by mouth 3 (three) times daily. (Patient not taking: Reported on 09/07/2015) 90 capsule 0  . naproxen (NAPROSYN) 500 MG tablet Take 1 tablet (500 mg total) by mouth 2 (two) times daily. (Patient not taking: Reported on 09/07/2015) 30 tablet 0  . traMADol (ULTRAM) 50 MG tablet Take 1 tablet (50 mg total) by mouth every 6 (six) hours as needed. (Patient not taking: Reported on 09/07/2015) 15 tablet 0   No facility-administered medications prior to visit.    ROS Review of Systems  Constitutional: Negative for fever, chills, fatigue and unexpected weight change.  HENT: Positive for sore throat.   Eyes: Negative for visual disturbance.  Respiratory: Positive for cough. Negative for shortness of breath.   Cardiovascular: Negative for chest pain, palpitations and leg swelling.  Gastrointestinal: Negative for nausea, vomiting, abdominal pain, diarrhea, constipation and blood in stool.  Endocrine: Negative for polydipsia, polyphagia and polyuria.  Musculoskeletal: Negative for myalgias, back pain, arthralgias, gait problem and neck pain.  Skin: Negative for rash.  Allergic/Immunologic: Negative for immunocompromised state.  Hematological: Negative for adenopathy. Does not bruise/bleed easily.  Psychiatric/Behavioral: Positive for decreased concentration. Negative for suicidal ideas, sleep disturbance and dysphoric mood. The patient is not nervous/anxious.     Objective:  BP 126/76 mmHg  Pulse 68  Temp(Src) 98.2 F (36.8 C) (Oral)  Resp 16  Ht 5'  10" (1.778 m)  Wt 227 lb (102.967 kg)  BMI 32.57 kg/m2  SpO2 96%  BP/Weight 11/18/2015 09/07/2015 01/07/2015  Systolic BP 126 123 146  Diastolic BP 76 78 94  Wt. (Lbs) 227 226 218  BMI 32.57 31.96 30.83    Physical Exam  Constitutional: He appears well-developed and well-nourished. No distress.  Well-appearing Absolutely no coughing during the exam   HENT:  Head:  Normocephalic and atraumatic.  Mouth/Throat: Mucous membranes are normal. Posterior oropharyngeal erythema present. No oropharyngeal exudate, posterior oropharyngeal edema or tonsillar abscesses.  Neck: Normal range of motion. Neck supple.  Cardiovascular: Normal rate, regular rhythm, normal heart sounds and intact distal pulses.   Pulmonary/Chest: Effort normal and breath sounds normal.  Musculoskeletal: He exhibits no edema.  Neurological: He is alert.  Skin: Skin is warm and dry. No rash noted. No erythema.  Psychiatric: He has a normal mood and affect.     Assessment & Plan:   Of note, patient became upset when he was informed that cough medicine with codeine would not be prescribed for chronic tonsillitis. He left before receiving his discharge instructions.   Molli HazardMatthew was seen today for medication refill.  Diagnoses and all orders for this visit:  Tonsillitis -     benzonatate (TESSALON) 100 MG capsule; Take 1 capsule (100 mg total) by mouth 2 (two) times daily as needed for cough. -     amoxicillin (AMOXIL) 500 MG capsule; Take 1 capsule (500 mg total) by mouth 3 (three) times daily. Open capsule and mix with applesauce or pudding -     Ambulatory referral to ENT  Attention-deficit hyperactivity disorder, predominantly hyperactive type    Meds ordered this encounter  Medications  . benzonatate (TESSALON) 100 MG capsule    Sig: Take 1 capsule (100 mg total) by mouth 2 (two) times daily as needed for cough.    Dispense:  20 capsule    Refill:  0  . amoxicillin (AMOXIL) 500 MG capsule    Sig: Take 1 capsule (500 mg total) by mouth 3 (three) times daily. Open capsule and mix with applesauce or pudding    Dispense:  30 capsule    Refill:  0    Follow-up: No Follow-up on file.   Dessa PhiJosalyn Pedram Goodchild MD

## 2015-11-18 NOTE — Patient Instructions (Addendum)
[facility] for [facility]. If assessment confirms ADHD, I will prescribe medication for you. You have most recently been tried on adderal and stratera in 2012.   Clifford Clay was seen today for medication refill.  Diagnoses and all orders for this visit:  Tonsillitis -     benzonatate (TESSALON) 100 MG capsule; Take 1 capsule (100 mg total) by mouth 2 (two) times daily as needed for cough. -     amoxicillin (AMOXIL) 500 MG capsule; Take 1 capsule (500 mg total) by mouth 3 (three) times daily. Open capsule and mix with applesauce or pudding -     Ambulatory referral to ENT   F/u after psychology evaluation   Dr. Armen PickupFunches

## 2015-11-18 NOTE — Progress Notes (Signed)
F/U ADHD c/cDry Cough, tonsillitis Pt stated coughing so hard it make him vomit  Pain scale #3 Tobacco user 1-3 cigarette per day  No suicidal thoughts in the past two week

## 2015-11-18 NOTE — Assessment & Plan Note (Signed)
A: chronic tonsillitis. Patient requesting cough medicine with codeine P: amox capsule so he can open them if he has difficulty swallowing Tessalon perles for cough ENT referral

## 2015-11-19 ENCOUNTER — Encounter: Payer: Self-pay | Admitting: Clinical

## 2015-11-19 NOTE — Progress Notes (Signed)
Dep[facility] screen [facility] 2/9 11/18/2015 09/07/2015 06/29/2014 05/01/2014  Decreased Interest 1 0 0 0  Down, Depressed, Hopeless 1 0 0 0  PHQ - 2 Score 2 0 0 0  Altered sleeping 1 - - -  Tired, decreased energy 1 - - -  Change in appetite 1 - - -  Feeling bad or failure about yourself  1 - - -  Trouble concentrating 2 - - -  Moving slowly or fidgety/restless 1 - - -  Suicidal thoughts 0 - - -  PHQ-9 Score 9 - - -    GAD 7 : Generalized Anxiety Score 11/18/2015  Nervous, Anxious, on Edge 1  Control/stop worrying 0  Worry too much - different things 0  Trouble relaxing 0  Restless 0  Easily annoyed or irritable 1  Afraid - awful might happen 0  Total GAD 7 Score 2
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
## 2016-06-05 ENCOUNTER — Other Ambulatory Visit: Payer: Self-pay | Admitting: Family Medicine

## 2016-06-06 LAB — CMP12+LP+TP+TSH+6AC+CBC/D/PLT
ALBUMIN: 4.6 g/dL (ref 3.5–5.5)
ALT: 34 IU/L (ref 0–44)
AST: 21 IU/L (ref 0–40)
Albumin/Globulin Ratio: 1.9 (ref 1.2–2.2)
Alkaline Phosphatase: 94 IU/L (ref 39–117)
BASOS ABS: 0 10*3/uL (ref 0.0–0.2)
BASOS: 0 %
BILIRUBIN TOTAL: 0.6 mg/dL (ref 0.0–1.2)
BUN/Creatinine Ratio: 13 (ref 9–20)
BUN: 13 mg/dL (ref 6–20)
CALCIUM: 9.4 mg/dL (ref 8.7–10.2)
CHLORIDE: 99 mmol/L (ref 96–106)
CHOLESTEROL TOTAL: 218 mg/dL — AB (ref 100–199)
Chol/HDL Ratio: 5.6 ratio units — ABNORMAL HIGH (ref 0.0–5.0)
Creatinine, Ser: 0.98 mg/dL (ref 0.76–1.27)
EOS (ABSOLUTE): 0.1 10*3/uL (ref 0.0–0.4)
Eos: 1 %
Estimated CHD Risk: 1.2 times avg. — ABNORMAL HIGH (ref 0.0–1.0)
FREE THYROXINE INDEX: 1.7 (ref 1.2–4.9)
GFR, EST AFRICAN AMERICAN: 121 mL/min/{1.73_m2} (ref >59)
GFR, EST NON AFRICAN AMERICAN: 104 mL/min/{1.73_m2} (ref >59)
GGT: 62 IU/L (ref 0–65)
Globulin, Total: 2.4 g/dL (ref 1.5–4.5)
Glucose: 88 mg/dL (ref 65–99)
HDL: 39 mg/dL — ABNORMAL LOW (ref >39)
Hematocrit: 45.1 % (ref 37.5–51.0)
Hemoglobin: 15.6 g/dL (ref 12.6–17.7)
IMMATURE GRANULOCYTES: 0 %
IRON: 124 ug/dL (ref 38–169)
Immature Grans (Abs): 0 10*3/uL (ref 0.0–0.1)
LDH: 163 IU/L (ref 121–224)
LDL Calculated: 136 mg/dL — ABNORMAL HIGH (ref 0–99)
LYMPHS ABS: 2.4 10*3/uL (ref 0.7–3.1)
Lymphs: 32 %
MCH: 30.3 pg (ref 26.6–33.0)
MCHC: 34.6 g/dL (ref 31.5–35.7)
MCV: 88 fL (ref 79–97)
Monocytes Absolute: 0.5 10*3/uL (ref 0.1–0.9)
Monocytes: 7 %
NEUTROS PCT: 60 %
Neutrophils Absolute: 4.3 10*3/uL (ref 1.4–7.0)
PHOSPHORUS: 3.4 mg/dL (ref 2.5–4.5)
PLATELETS: 396 10*3/uL — AB (ref 150–379)
Potassium: 4 mmol/L (ref 3.5–5.2)
RBC: 5.15 x10E6/uL (ref 4.14–5.80)
RDW: 12.7 % (ref 12.3–15.4)
Sodium: 138 mmol/L (ref 134–144)
T3 UPTAKE RATIO: 25 % (ref 24–39)
T4, Total: 6.7 ug/dL (ref 4.5–12.0)
TOTAL PROTEIN: 7 g/dL (ref 6.0–8.5)
TSH: 2.39 u[IU]/mL (ref 0.450–4.500)
Triglycerides: 214 mg/dL — ABNORMAL HIGH (ref 0–149)
Uric Acid: 10.4 mg/dL — ABNORMAL HIGH (ref 3.7–8.6)
VLDL Cholesterol Cal: 43 mg/dL — ABNORMAL HIGH (ref 5–40)
WBC: 7.3 10*3/uL (ref 3.4–10.8)

## 2016-06-06 LAB — HGB A1C W/O EAG: HEMOGLOBIN A1C: 4.8 % (ref 4.8–5.6)

## 2016-11-22 ENCOUNTER — Telehealth: Payer: Self-pay | Admitting: Family Medicine

## 2016-11-22 NOTE — Telephone Encounter (Signed)
Patient called the office the speak with PCP regarding getting medication for ADD/ ADHD. Pt stated that he had informed PCP of him taking medication for this since childhood. PCP advised patient to schedule an appt with specialist. Pt has tried scheduling appt with specialist but appt are 6 months out. Pt cannot wait. He can't complete daily task. Patient needs to know what stesp he should take or documents are neccessary. Please follow up.  Thank you.

## 2016-11-22 NOTE — Telephone Encounter (Signed)
Pt has not been seen in office since last year. Pt was instructed to follow up with mental health for medication.

## 2016-12-08 ENCOUNTER — Ambulatory Visit: Payer: Self-pay | Attending: Family Medicine | Admitting: Family Medicine

## 2016-12-08 VITALS — BP 126/87 | HR 64 | Temp 98.5°F | Resp 18 | Ht 70.0 in | Wt 233.6 lb

## 2016-12-08 DIAGNOSIS — Z79899 Other long term (current) drug therapy: Secondary | ICD-10-CM | POA: Insufficient documentation

## 2016-12-08 DIAGNOSIS — F17210 Nicotine dependence, cigarettes, uncomplicated: Secondary | ICD-10-CM | POA: Insufficient documentation

## 2016-12-08 DIAGNOSIS — F901 Attention-deficit hyperactivity disorder, predominantly hyperactive type: Secondary | ICD-10-CM

## 2016-12-08 DIAGNOSIS — F909 Attention-deficit hyperactivity disorder, unspecified type: Secondary | ICD-10-CM | POA: Insufficient documentation

## 2016-12-08 DIAGNOSIS — J029 Acute pharyngitis, unspecified: Secondary | ICD-10-CM | POA: Insufficient documentation

## 2016-12-08 MED ORDER — DYCLONINE HCL 2 MG MT LOZG: 1.0000 | LOZENGE | OROMUCOSAL | 0 refills | Status: AC | PRN

## 2016-12-08 MED ORDER — ATOMOXETINE HCL 60 MG PO CAPS: 60.0000 mg | ORAL_CAPSULE | Freq: Every day | ORAL | 0 refills | Status: DC

## 2016-12-08 NOTE — Progress Notes (Signed)
Subjective:  Patient ID: Clifford Clay, male    DOB: November 22, 1985  Age: 31 y.o. MRN: [id number]  CC: No chief complaint on file.   HPI [name] presents for   History of ADHD: History of self-reported ADHD with medication use since childhood. Reports trying several different medications for ADHD. Reports the last time he took medication for ADHD was prior to college in 2007. Reports last medication he took was Adderall and that helped with his symptoms. Reports history of ADHD worsening about one month ago. Reports lack of focus difficulty completing tasks, and being distracted easily. Denies any history of counseling or therapy. Reports new opportunity for promotion at work and is requesting ADHD medication. Denies any SI/HI   History of  Pharyngitis: Reports symptoms began 5 years ago. Last episode was 2-3 months ago. Reports throat irritation, with cough he denies taking anything for symptoms. He is a current smoker. He reports smoking 1 cigarette per day.    Outpatient Medications Prior to Visit  Medication Sig Dispense Refill  . amoxicillin (AMOXIL) 500 MG capsule Take 1 capsule (500 mg total) by mouth 3 (three) times daily. Open capsule and mix with applesauce or pudding 30 capsule 0  . benzonatate (TESSALON) 100 MG capsule Take 1 capsule (100 mg total) by mouth 2 (two) times daily as needed for cough. 20 capsule 0   No facility-administered medications prior to visit.     ROS Review of Systems  HENT:       Throat irritation  Respiratory: Negative.   Cardiovascular: Negative.   Psychiatric/Behavioral: Positive for behavioral problems and decreased concentration.    Objective:  BP 126/87 (BP Location: Left Arm, Patient Position: Sitting, Cuff Size: Normal)   Pulse 64   Temp 98.5 F (36.9 C) (Oral)   Resp 18   Ht  (1.778 m)   Wt 233 lb 9.6 oz (106 kg)   SpO2 99%   BMI 33.52 kg/m   BP/Weight 12/08/2016 11/18/2015 09/07/2015  Systolic BP 126 126 123  Diastolic  BP 87 76 78  Wt. (Lbs) 233.6 227 226  BMI 33.52 32.57 31.96     Physical Exam  HENT:  Head: Normocephalic and atraumatic.  Right Ear: External ear normal.  Left Ear: External ear normal.  Nose: Nose normal.  Mouth/Throat: Oropharyngeal exudate present.  Eyes: Conjunctivae are normal. Pupils are equal, round, and reactive to light.  Cardiovascular: Normal rate, regular rhythm, normal heart sounds and intact distal pulses.   Pulmonary/Chest: Effort normal and breath sounds normal.  Abdominal: Soft. Bowel sounds are normal.  Skin: Skin is warm and dry.  Psychiatric: He has a normal mood and affect.  Nursing note and vitals reviewed.   Assessment & Plan:   Problem List Items Addressed This Visit      Other   ADHD (attention deficit hyperactivity disorder) - Primary (Chronic)   Relevant Medications   atomoxetine (STRATTERA) 60 MG capsule   Other Relevant Orders   Ambulatory referral to Psychology   Light smoker    Other Visit Diagnoses    Pharyngitis, unspecified etiology       Relevant Medications   Dyclonine HCl (SUCRETS SORE THROAT) 2 MG LOZG   Other Relevant Orders   Upper Respiratory Culture, Routine (Completed)      Meds ordered this encounter  Medications  . atomoxetine (STRATTERA) 60 MG capsule    Sig: Take 1 capsule (60 mg total) by mouth daily.    Dispense:  60 capsule  Refill:  0    Order Specific Question:   Supervising Provider    Answer:   Quentin Angst [id number]  . Dyclonine HCl (SUCRETS SORE THROAT) 2 MG LOZG    Sig: Use as directed 1 lozenge (2 mg total) in the mouth or throat as needed.    Refill:  0    Order Specific Question:   Supervising Provider    Answer:   Quentin Angst [[id number]]    Follow-up: Return in about 8 weeks (around 02/02/2017) for ADHD.   Lizbeth Bark FNP

## 2016-12-08 NOTE — Patient Instructions (Addendum)
Pharyngitis Pharyngitis is redness, pain, and swelling (inflammation) of your pharynx. What are the causes? Pharyngitis is usually caused by infection. Most of the time, these infections are from viruses (viral) and are part of a cold. However, sometimes pharyngitis is caused by bacteria (bacterial). Pharyngitis can also be caused by allergies. Viral pharyngitis may be spread from person to person by coughing, sneezing, and personal items or utensils (cups, forks, spoons, toothbrushes). Bacterial pharyngitis may be spread from person to person by more intimate contact, such as kissing. What are the signs or symptoms? Symptoms of pharyngitis include:  Sore throat.  Tiredness (fatigue).  Low-grade fever.  Headache.  Joint pain and muscle aches.  Skin rashes.  Swollen lymph nodes.  Plaque-like film on throat or tonsils (often seen with bacterial pharyngitis). How is this diagnosed? Your health care provider will ask you questions about your illness and your symptoms. Your medical history, along with a physical exam, is often all that is needed to diagnose pharyngitis. Sometimes, a rapid strep test is done. Other lab tests may also be done, depending on the suspected cause. How is this treated? Viral pharyngitis will usually get better in 3-4 days without the use of medicine. Bacterial pharyngitis is treated with medicines that kill germs (antibiotics). Follow these instructions at home:  Drink enough water and fluids to keep your urine clear or pale yellow.  Only take over-the-counter or prescription medicines as directed by your health care provider:  If you are prescribed antibiotics, make sure you finish them even if you start to feel better.  Do not take aspirin.  Get lots of rest.  Gargle with 8 oz of salt water ( tsp of salt per 1 qt of water) as often as every 1-2 hours to soothe your throat.  Throat lozenges (if you are not at risk for choking) or sprays may be used to  soothe your throat. Contact a health care provider if:  You have large, tender lumps in your neck.  You have a rash.  You cough up green, yellow-brown, or bloody spit. Get help right away if:  Your neck becomes stiff.  You drool or are unable to swallow liquids.  You vomit or are unable to keep medicines or liquids down.  You have severe pain that does not go away with the use of recommended medicines.  You have trouble breathing (not caused by a stuffy nose). This information is not intended to replace advice given to you by your health care provider. Make sure you discuss any questions you have with your health care provider. Document Released: 08/21/2005 Document Revised: 01/27/2016 Document Reviewed: 04/28/2013 Elsevier Interactive Patient Education  2017 Elsevier Inc.  Atomoxetine capsules What is this medicine? ATOMOXETINE (AT oh mox e teen) is used to treat attention deficit/hyperactivity disorder, also known as ADHD. It is not a stimulant like other drugs for ADHD. This drug can improve attention span, concentration, and emotional control. It can also reduce restless or overactive behavior. This medicine may be used for other purposes; ask your health care provider or pharmacist if you have questions. COMMON BRAND NAME(S): Strattera What should I tell my health care provider before I take this medicine? They need to know if you have any of these conditions: -glaucoma -high or low blood pressure -history of stroke -irregular heartbeat or other cardiac disease -liver disease -mania or bipolar disorder -pheochromocytoma -suicidal thoughts -an unusual or allergic reaction to atomoxetine, other medicines, foods, dyes, or preservatives -pregnant or trying to get  pregnant -breast-feeding How should I use this medicine? Take this medicine by mouth with a glass of water. Follow the directions on the prescription label. You can take it with or without food. If it upsets your  stomach, take it with food. If you have difficulty sleeping and you take more than 1 dose per day, take your last dose before 6 PM. Take your medicine at regular intervals. Do not take it more often than directed. Do not stop taking except on your doctor's advice. A special MedGuide will be given to you by the pharmacist with each prescription and refill. Be sure to read this information carefully each time. Talk to your pediatrician regarding the use of this medicine in children. While this drug may be prescribed for children as young as 6 years for selected conditions, precautions do apply. Overdosage: If you think you have taken too much of this medicine contact a poison control center or emergency room at once. NOTE: This medicine is only for you. Do not share this medicine with others. What if I miss a dose? If you miss a dose, take it as soon as you can. If it is almost time for your next dose, take only that dose. Do not take double or extra doses. What may interact with this medicine? Do not take this medicine with any of the following medications: -cisapride -dofetilide -dronedarone -MAOIs like Carbex, Eldepryl, Marplan, Nardil, and Parnate -pimozide -reboxetine -thioridazine -ziprasidone This medicine may also interact with the following medications: -certain medicines for blood pressure, heart disease, irregular heart beat -certain medicines for depression, anxiety, or psychotic disturbances -certain medicines for lung disease like albuterol -cold or allergy medicines -fluoxetine -medicines that increase blood pressure like dopamine, dobutamine, or ephedrine -other medicines that prolong the QT interval (cause an abnormal heart rhythm) -paroxetine -quinidine -stimulant medicines for attention disorders, weight loss, or to stay awake This list may not describe all possible interactions. Give your health care provider a list of all the medicines, herbs, non-prescription drugs, or  dietary supplements you use. Also tell them if you smoke, drink alcohol, or use illegal drugs. Some items may interact with your medicine. What should I watch for while using this medicine? It may take a week or more for this medicine to take effect. This is why it is very important to continue taking the medicine and not miss any doses. If you have been taking this medicine regularly for some time, do not suddenly stop taking it. Ask your doctor or health care professional for advice. Rarely, this medicine may increase thoughts of suicide or suicide attempts in children and teenagers. Call your child's health care professional right away if your child or teenager has new or increased thoughts of suicide or has changes in mood or behavior like becoming irritable or anxious. Regularly monitor your child for these behavioral changes. For males, contact you doctor or health care professional right away if you have an erection that lasts longer than 4 hours or if it becomes painful. This may be a sign of serious problem and must be treated right away to prevent permanent damage. You may get drowsy or dizzy. Do not drive, use machinery, or do anything that needs mental alertness until you know how this medicine affects you. Do not stand or sit up quickly, especially if you are an older patient. This reduces the risk of dizzy or fainting spells. Alcohol can make you more drowsy and dizzy. Avoid alcoholic drinks. Do not treat yourself for  coughs, colds or allergies without asking your doctor or health care professional for advice. Some ingredients can increase possible side effects. Your mouth may get dry. Chewing sugarless gum or sucking hard candy, and drinking plenty of water will help. What side effects may I notice from receiving this medicine? Side effects that you should report to your doctor or health care professional as soon as possible: -allergic reactions like skin rash, itching or hives, swelling of  the face, lips, or tongue -breathing problems -chest pain -dark urine -fast, irregular heartbeat -general ill feeling or flu-like symptoms -high blood pressure -males: prolonged or painful erection -stomach pain or tenderness -trouble passing urine or change in the amount of urine -vomiting -weight loss -yellowing of the eyes or skin Side effects that usually do not require medical attention (report to your doctor or health care professional if they continue or are bothersome): -change in sex drive or performance -constipation or diarrhea -headache -loss of appetite -menstrual period irregularities -nausea -stomach upset This list may not describe all possible side effects. Call your doctor for medical advice about side effects. You may report side effects to FDA at 1-800-FDA-1088. Where should I keep my medicine? Keep out of the reach of children. Store at room temperature between 15 and 30 degrees C (59 and 86 degrees F). Throw away any unused medication after the expiration date. NOTE: This sheet is a summary. It may not cover all possible information. If you have questions about this medicine, talk to your doctor, pharmacist, or health care provider.  2018 Elsevier/Gold Standard (2014-01-02 15:29:22)

## 2016-12-08 NOTE — Progress Notes (Signed)
Patient is here for ADD  Patient needs medication for it  Patient denies pain for today  Patient is not on any current medication  Patient has eaten for today  Patient stated that he was on ADD med back in high school stop it thought he could control it by himself  Patient stated irritation of throat & dryness

## 2016-12-11 LAB — UPPER RESPIRATORY CULTURE, ROUTINE

## 2016-12-13 NOTE — Telephone Encounter (Signed)
-----   Message from Lizbeth Bark, FNP sent at 12/13/2016  4:09 AM EDT ----- Throat culture normal. No signs of bacterial or viral infection present.

## 2016-12-13 NOTE — Telephone Encounter (Signed)
CMA call to go over results  Patient did not answer but left a VM stating the reason of the call & to call back

## 2016-12-13 NOTE — Telephone Encounter (Signed)
Patient return CMA call   Patient Verify DOB   Patient was aware and understood  

## 2016-12-25 ENCOUNTER — Ambulatory Visit: Payer: Self-pay | Attending: Internal Medicine | Admitting: Family Medicine

## 2016-12-25 DIAGNOSIS — F901 Attention-deficit hyperactivity disorder, predominantly hyperactive type: Secondary | ICD-10-CM

## 2016-12-25 DIAGNOSIS — F909 Attention-deficit hyperactivity disorder, unspecified type: Secondary | ICD-10-CM | POA: Insufficient documentation

## 2016-12-25 DIAGNOSIS — L247 Irritant contact dermatitis due to plants, except food: Secondary | ICD-10-CM | POA: Insufficient documentation

## 2016-12-25 DIAGNOSIS — Z79899 Other long term (current) drug therapy: Secondary | ICD-10-CM | POA: Insufficient documentation

## 2016-12-25 MED ORDER — DIPHENHYDRAMINE HCL 25 MG PO CAPS: 25.0000 mg | ORAL_CAPSULE | Freq: Four times a day (QID) | ORAL | 0 refills | Status: AC | PRN

## 2016-12-25 MED ORDER — COLLOIDAL OATMEAL BATH EX PACK: 1.0000 | PACK | Freq: Every day | CUTANEOUS | Status: AC

## 2016-12-25 MED ORDER — FAMOTIDINE 20 MG PO TABS: 20.0000 mg | ORAL_TABLET | Freq: Every day | ORAL | 0 refills | Status: AC

## 2016-12-25 MED ORDER — PREDNISONE 20 MG PO TABS: ORAL_TABLET | ORAL | 0 refills | Status: AC

## 2016-12-25 MED ORDER — ATOMOXETINE HCL 60 MG PO CAPS: 60.0000 mg | ORAL_CAPSULE | Freq: Every day | ORAL | 0 refills | Status: DC

## 2016-12-25 MED ORDER — CALAMINE EX LOTN: 1.0000 "application " | TOPICAL_LOTION | CUTANEOUS | 0 refills | Status: AC | PRN

## 2016-12-25 MED FILL — ?PREDNISONE 20 MG TABLET: 20 | 21 days supply | Qty: 42 | Fill #0

## 2016-12-25 MED FILL — ?FAMOTIDINE 20 MG TABLET: 20 | 7 days supply | Qty: 7 | Fill #0

## 2016-12-25 NOTE — Patient Instructions (Addendum)
If you develop any shortness of breath, difficulty breathing, swelling of the tongue or throat, or vision difficulties go the the Emergency Department.   Poison Ivy Dermatitis Poison ivy dermatitis is redness and soreness (inflammation) of the skin. It is caused by a chemical that is found on the leaves of the poison ivy plant. You may also have itching, a rash, and blisters. Symptoms often clear up in 1-2 weeks. You may get this condition by touching a poison ivy plant. You can also get it by touching something that has the chemical on it. This may include animals or objects that have come in contact with the plant. Follow these instructions at home: General instructions   Take or apply over-the-counter and prescription medicines only as told by your doctor.  If you touch poison ivy, wash your skin with soap and cold water right away.  Use hydrocortisone creams or calamine lotion as needed to help with itching.  Take oatmeal baths as needed. Use colloidal oatmeal. You can get this at a pharmacy or grocery store. Follow the instructions on the package.  Do not scratch or rub your skin.  While you have the rash, wash your clothes right after you wear them. Prevention   Know what poison ivy looks like so you can avoid it. This plant has three leaves with flowering branches on a single stem. The leaves are glossy. They have uneven edges that come to a point at the front.  If you have touched poison ivy, wash with soap and water right away. Be sure to wash under your fingernails.  When hiking or camping, wear long pants, a long-sleeved shirt, tall socks, and hiking boots. You can also use a lotion on your skin that helps to prevent contact with the chemical on the plant.  If you think that your clothes or outdoor gear came in contact with poison ivy, rinse them off with a garden hose before you bring them inside your house. Contact a doctor if:  You have open sores in the rash area.  You  have more redness, swelling, or pain in the affected area.  You have redness that spreads beyond the rash area.  You have fluid, blood, or pus coming from the affected area.  You have a fever.  You have a rash over a large area of your body.  You have a rash on your eyes, mouth, or genitals.  Your rash does not get better after a few days. Get help right away if:  Your face swells or your eyes swell shut.  You have trouble breathing.  You have trouble swallowing. This information is not intended to replace advice given to you by your health care provider. Make sure you discuss any questions you have with your health care provider. Document Released: 09/23/2010 Document Revised: 01/27/2016 Document Reviewed: 01/27/2015 Elsevier Interactive Patient Education  2017 [employer].

## 2016-12-25 NOTE — Progress Notes (Signed)
Subjective:  Patient ID: Clifford Clay, male    DOB: [birth year]  Age: 31 y.o. MRN: [id number]  CC: Poison Ivy   HPI Clifford Clay presents for   Contact dermatitis: Reports symptoms began Friday. He works with the [employer]. Reports while cleaning up of lower bases coming into contact with plant. Reports itching, redness, swelling, and small raised papules aftwerards.  Reports that after coming into contact with the plant going to using the bathroom. Bilateral arms, face, and penis were affected.  History of ADHD: Has not taken Strattera prescribed for ADHD per last visit. He reports he would like to start Strattera this visit. Self-reported history of  ADHD with medication use since childhood.Reports lack of focus difficulty completing tasks, and being distracted easily. Denies any history of counseling or therapy. Denies any SI/HI    Outpatient Medications Prior to Visit  Medication Sig Dispense Refill  . amoxicillin (AMOXIL) 500 MG capsule Take 1 capsule (500 mg total) by mouth 3 (three) times daily. Open capsule and mix with applesauce or pudding 30 capsule 0  . benzonatate (TESSALON) 100 MG capsule Take 1 capsule (100 mg total) by mouth 2 (two) times daily as needed for cough. 20 capsule 0  . Dyclonine HCl (SUCRETS SORE THROAT) 2 MG LOZG Use as directed 1 lozenge (2 mg total) in the mouth or throat as needed.  0  . atomoxetine (STRATTERA) 60 MG capsule Take 1 capsule (60 mg total) by mouth daily. 60 capsule 0   No facility-administered medications prior to visit.      ROS Review of Systems  Constitutional: Negative.   HENT: Positive for facial swelling. Negative for trouble swallowing.   Eyes: Negative for discharge and visual disturbance.  Respiratory: Negative.   Cardiovascular: Negative.   Gastrointestinal: Negative.   Skin: Positive for rash.    Objective:  There were no vitals taken for this visit.  BP/Weight 12/08/2016 11/18/2015 09/07/2015  Systolic BP 126  409 123  Diastolic BP 87 76 78  Wt. (Lbs) 233.6 227 226  BMI 33.52 32.57 31.96    Physical Exam  HENT:  Head: Normocephalic.  Right Ear: External ear normal.  Left Ear: External ear normal.  Nose: Nose normal.  Mouth/Throat: Oropharynx is clear and moist.  Facial swelling and redness.  Eyes: Conjunctivae are normal. Pupils are equal, round, and reactive to light.  Cardiovascular: Normal rate, regular rhythm, normal heart sounds and intact distal pulses.   Pulmonary/Chest: Effort normal and breath sounds normal. No stridor. No respiratory distress.  Abdominal: Soft. Bowel sounds are normal.  Genitourinary:  Genitourinary Comments: Skin rash to glans penis and with swelling.  Skin: Skin is warm and dry. Rash (biilateral arms, face, and penis.) noted. Rash is maculopapular.  Nursing note and vitals reviewed.  Assessment & Plan:   Problem List Items Addressed This Visit      Other   ADHD (attention deficit hyperactivity disorder) (Chronic)   Relevant Medications   atomoxetine (STRATTERA) 60 MG capsule    Other Visit Diagnoses    Irritant contact dermatitis due to plants, except food    -  Primary   Relevant Medications   predniSONE (DELTASONE) 20 MG tablet   diphenhydrAMINE (BENADRYL) 25 mg capsule   calamine lotion   famotidine (PEPCID) 20 MG tablet   colloidal oatmeal PACK      Meds ordered this encounter  Medications  . predniSONE (DELTASONE) 20 MG tablet    Sig: Take 3 tablets ( ) x 1 week; Then  2 tablets (40 mg) x 1 week; Then 1 tablet (20 mg) x 1 week.    Dispense:  42 tablet    Refill:  0    Order Specific Question:   Supervising Provider    Answer:   Quentin Angst [id number]  . diphenhydrAMINE (BENADRYL) 25 mg capsule    Sig: Take 1 capsule (25 mg total) by mouth every 6 (six) hours as needed.    Dispense:  30 capsule    Refill:  0    Order Specific Question:   Supervising Provider    Answer:   Quentin Angst [id number]  . calamine lotion     Sig: Apply 1 application topically as needed for itching. Apply to affected areas. Do not apply to genital areas.    Dispense:  177 mL    Refill:  0    Order Specific Question:   Supervising Provider    Answer:   Quentin Angst [id number]  . famotidine (PEPCID) 20 MG tablet    Sig: Take 1 tablet (20 mg total) by mouth daily. For 1 week.    Dispense:  7 tablet    Refill:  0    Order Specific Question:   Supervising Provider    Answer:   Quentin Angst [id number]  . colloidal oatmeal PACK    Sig: Apply 1 each topically daily.    Order Specific Question:   Supervising Provider    Answer:   Quentin Angst [id number]  . atomoxetine (STRATTERA) 60 MG capsule    Sig: Take 1 capsule (60 mg total) by mouth daily.    Dispense:  60 capsule    Refill:  0    Order Specific Question:   Supervising Provider    Answer:   Quentin Angst [id number]    Follow-up: Return if symptoms worsen or fail to improve. Return in about 8 weeks (around 02/19/2017), for ADHD.   Lizbeth Bark FNP

## 2017-03-08 ENCOUNTER — Ambulatory Visit: Payer: Self-pay

## 2017-05-11 ENCOUNTER — Encounter: Payer: Self-pay | Admitting: Family Medicine

## 2017-05-11 ENCOUNTER — Ambulatory Visit: Payer: Self-pay | Attending: Family Medicine | Admitting: Family Medicine

## 2017-05-11 VITALS — BP 133/83 | HR 70 | Temp 99.0°F | Resp 18 | Ht 70.0 in | Wt 223.0 lb

## 2017-05-11 DIAGNOSIS — F901 Attention-deficit hyperactivity disorder, predominantly hyperactive type: Secondary | ICD-10-CM

## 2017-05-11 DIAGNOSIS — J029 Acute pharyngitis, unspecified: Secondary | ICD-10-CM

## 2017-05-11 DIAGNOSIS — J069 Acute upper respiratory infection, unspecified: Secondary | ICD-10-CM

## 2017-05-11 DIAGNOSIS — B009 Herpesviral infection, unspecified: Secondary | ICD-10-CM | POA: Insufficient documentation

## 2017-05-11 DIAGNOSIS — Z79899 Other long term (current) drug therapy: Secondary | ICD-10-CM | POA: Insufficient documentation

## 2017-05-11 DIAGNOSIS — B9789 Other viral agents as the cause of diseases classified elsewhere: Secondary | ICD-10-CM

## 2017-05-11 DIAGNOSIS — F909 Attention-deficit hyperactivity disorder, unspecified type: Secondary | ICD-10-CM | POA: Insufficient documentation

## 2017-05-11 LAB — POCT RAPID STREP A (OFFICE): Rapid Strep A Screen: NEGATIVE

## 2017-05-11 MED ORDER — ATOMOXETINE HCL 60 MG PO CAPS: 60.0000 mg | ORAL_CAPSULE | Freq: Every day | ORAL | 2 refills | Status: DC

## 2017-05-11 MED ORDER — PHENOL 1.4 % MT LIQD: 1.0000 | OROMUCOSAL | 0 refills | Status: AC | PRN

## 2017-05-11 MED ORDER — FLUTICASONE PROPIONATE 50 MCG/ACT NA SUSP: 2.0000 | Freq: Every day | NASAL | 0 refills | Status: AC

## 2017-05-11 MED ORDER — GUAIFENESIN-DM 100-10 MG/5ML PO SYRP: 5.0000 mL | ORAL_SOLUTION | ORAL | 0 refills | Status: AC | PRN

## 2017-05-11 NOTE — Progress Notes (Signed)
Patient is here for throat pan   Feeling light headed  Patient complains right wrist pain

## 2017-05-11 NOTE — Progress Notes (Signed)
Subjective:  Patient ID: Clifford Clay, male    DOB: 1986/02/16  Age: 31 y.o. MRN: [id number]  CC: Sore Throat   HPI Clifford Clay presents for Pharyngitis. Associated symptoms include redness and lesions to the posterior oropharynx. He denies any fevers or contacts with similar symptoms. Symptoms include cough and sore throat. Onset of symptoms was 1 day ago, unchanged since that time. He reports taking NyQuil for symptoms. History of ADHD: History of self-reported ADHD with medication use since childhood. Reports trying several different medications for ADHD. Reports the last time he took medication for ADHD was prior to college in 2007.  Was prescribed Strattera previously he reports not following up with pharmacy regarding discount paperwork. He denies follow up with psychology referral. Denies any SI/HI. He declines speaking with LCSW at this time.    Outpatient Medications Prior to Visit  Medication Sig Dispense Refill  . amoxicillin (AMOXIL) 500 MG capsule Take 1 capsule (500 mg total) by mouth 3 (three) times daily. Open capsule and mix with applesauce or pudding 30 capsule 0  . benzonatate (TESSALON) 100 MG capsule Take 1 capsule (100 mg total) by mouth 2 (two) times daily as needed for cough. 20 capsule 0  . calamine lotion Apply 1 application topically as needed for itching. Apply to affected areas. Do not apply to genital areas. 177 mL 0  . colloidal oatmeal PACK Apply 1 each topically daily.    . diphenhydrAMINE (BENADRYL) 25 mg capsule Take 1 capsule (25 mg total) by mouth every 6 (six) hours as needed. 30 capsule 0  . Dyclonine HCl (SUCRETS SORE THROAT) 2 MG LOZG Use as directed 1 lozenge (2 mg total) in the mouth or throat as needed.  0  . famotidine (PEPCID) 20 MG tablet Take 1 tablet (20 mg total) by mouth daily. For 1 week. 7 tablet 0  . predniSONE (DELTASONE) 20 MG tablet Take 3 tablets ( ) x 1 week; Then 2 tablets (40 mg) x 1 week; Then 1 tablet (20 mg) x 1 week. 42  tablet 0  . atomoxetine (STRATTERA) 60 MG capsule Take 1 capsule (60 mg total) by mouth daily. 60 capsule 0   No facility-administered medications prior to visit.     ROS Review of Systems  Constitutional: Negative.   HENT: Positive for sore throat.   Eyes: Negative.   Respiratory: Positive for cough.   Cardiovascular: Negative.   Gastrointestinal: Negative.   Skin: Negative.    Objective:  BP 133/83 (BP Location: Left Arm, Patient Position: Sitting, Cuff Size: Normal)   Pulse 70   Temp 99 F (37.2 C) (Oral)   Resp 18   Ht  (1.778 m)   Wt 223 lb (101.2 kg)   SpO2 97%   BMI 32.00 kg/m   BP/Weight 05/11/2017 12/08/2016 11/18/2015  Systolic BP 133 126 126  Diastolic BP 83 87 76  Wt. (Lbs) 223 233.6 227  BMI 32 33.52 32.57     Physical Exam  Constitutional: He appears well-developed and well-nourished.  HENT:  Head: Normocephalic and atraumatic.  Right Ear: External ear normal.  Left Ear: External ear normal.  Nose: Rhinorrhea (white, thin mucous) present.  Mouth/Throat: Uvula is midline and mucous membranes are normal. Posterior oropharyngeal erythema present. No oropharyngeal exudate or tonsillar abscesses.  No lesions visualized   Eyes: Pupils are equal, round, and reactive to light. Conjunctivae are normal.  Neck: Normal range of motion. Neck supple.  Cardiovascular: Normal rate, regular rhythm, normal heart sounds and  intact distal pulses.   Pulmonary/Chest: Effort normal and breath sounds normal.  Abdominal: Soft. Bowel sounds are normal.  Lymphadenopathy:    He has cervical adenopathy (tenderness).  Skin: Skin is warm and dry.  Nursing note and vitals reviewed.    Assessment & Plan:   Problem List Items Addressed This Visit      Other   ADHD (attention deficit hyperactivity disorder) (Chronic)   Relevant Medications   atomoxetine (STRATTERA) 60 MG capsule   Other Relevant Orders   Ambulatory referral to Psychology    Other Visit Diagnoses     Viral URI with cough    -  Primary   Relevant Medications   guaiFENesin-dextromethorphan (ROBITUSSIN DM) 100-10 MG/5ML syrup   fluticasone (FLONASE) 50 MCG/ACT nasal spray   Pharyngitis, unspecified etiology       Relevant Medications   phenol (CHLORASEPTIC MOUTH PAIN) 1.4 % LIQD   Other Relevant Orders   Upper Respiratory Culture, Routine   Rapid Strep A (Completed)   HSV(herpes simplex vrs) 1+2 ab-IgG (Completed)      Meds ordered this encounter  Medications  . atomoxetine (STRATTERA) 60 MG capsule    Sig: Take 1 capsule (60 mg total) by mouth daily.    Dispense:  30 capsule    Refill:  2    Order Specific Question:   Supervising Provider    Answer:   Quentin AngstJEGEDE, OLUGBEMIGA E [id number]  . phenol (CHLORASEPTIC MOUTH PAIN) 1.4 % LIQD    Sig: Use as directed 1 spray in the mouth or throat as needed for throat irritation / pain.    Refill:  0    Order Specific Question:   Supervising Provider    Answer:   Quentin AngstJEGEDE, OLUGBEMIGA E [id number]  . guaiFENesin-dextromethorphan (ROBITUSSIN DM) 100-10 MG/5ML syrup    Sig: Take 5 mLs by mouth every 4 (four) hours as needed for cough.    Dispense:  236 mL    Refill:  0    Order Specific Question:   Supervising Provider    Answer:   Quentin AngstJEGEDE, OLUGBEMIGA E [id number]  . fluticasone (FLONASE) 50 MCG/ACT nasal spray    Sig: Place 2 sprays into both nostrils daily.    Dispense:  16 g    Refill:  0    Order Specific Question:   Supervising Provider    Answer:   Quentin AngstJEGEDE, OLUGBEMIGA E [[id number]]    Follow-up: Return in about 3 months (around 08/10/2017), or if symptoms worsen or fail to improve, for ADHD.   Lizbeth BarkMandesia R Staci Dack FNP

## 2017-05-11 NOTE — Patient Instructions (Signed)
Pharyngitis Pharyngitis is a sore throat (pharynx). There is redness, pain, and swelling of your throat. Follow these instructions at home:  Drink enough fluids to keep your pee (urine) clear or pale yellow.  Only take medicine as told by your doctor. ? You may get sick again if you do not take medicine as told. Finish your medicines, even if you start to feel better. ? Do not take aspirin.  Rest.  Rinse your mouth (gargle) with salt water ( tsp of salt per 1 qt of water) every 1-2 hours. This will help the pain.  If you are not at risk for choking, you can suck on hard candy or sore throat lozenges. Contact a doctor if:  You have large, tender lumps on your neck.  You have a rash.  You cough up green, yellow-brown, or bloody spit. Get help right away if:  You have a stiff neck.  You drool or cannot swallow liquids.  You throw up (vomit) or are not able to keep medicine or liquids down.  You have very bad pain that does not go away with medicine.  You have problems breathing (not from a stuffy nose). This information is not intended to replace advice given to you by your health care provider. Make sure you discuss any questions you have with your health care provider. Document Released: 02/07/2008 Document Revised: 01/27/2016 Document Reviewed: 04/28/2013 Elsevier Interactive Patient Education  2017 [employer] Inc.   Upper Respiratory Infection, Adult Most upper respiratory infections (URIs) are caused by a virus. A URI affects the nose, throat, and upper air passages. The most common type of URI is often called "the common cold." Follow these instructions at home:  Take medicines only as told by your doctor.  Gargle warm saltwater or take cough drops to comfort your throat as told by your doctor.  Use a warm mist humidifier or inhale steam from a shower to increase air moisture. This may make it easier to breathe.  Drink enough fluid to keep your pee (urine) clear or  pale yellow.  Eat soups and other clear broths.  Have a healthy diet.  Rest as needed.  Go back to work when your fever is gone or your doctor says it is okay. ? You may need to stay home longer to avoid giving your URI to others. ? You can also wear a face mask and wash your hands often to prevent spread of the virus.  Use your inhaler more if you have asthma.  Do not use any tobacco products, including cigarettes, chewing tobacco, or electronic cigarettes. If you need help quitting, ask your doctor. Contact a doctor if:  You are getting worse, not better.  Your symptoms are not helped by medicine.  You have chills.  You are getting more short of breath.  You have brown or red mucus.  You have yellow or brown discharge from your nose.  You have pain in your face, especially when you bend forward.  You have a fever.  You have puffy (swollen) neck glands.  You have pain while swallowing.  You have white areas in the back of your throat. Get help right away if:  You have very bad or constant: ? Headache. ? Ear pain. ? Pain in your forehead, behind your eyes, and over your cheekbones (sinus pain). ? Chest pain.  You have long-lasting (chronic) lung disease and any of the following: ? Wheezing. ? Long-lasting cough. ? Coughing up blood. ? A change in your  usual mucus.  You have a stiff neck.  You have changes in your: ? Vision. ? Hearing. ? Thinking. ? Mood. This information is not intended to replace advice given to you by your health care provider. Make sure you discuss any questions you have with your health care provider. Document Released: 02/07/2008 Document Revised: 04/23/2016 Document Reviewed: 11/26/2013 Elsevier Interactive Patient Education  2018 [employer].

## 2017-05-12 LAB — HSV(HERPES SIMPLEX VRS) I + II AB-IGG
HSV 1 Glycoprotein G Ab, IgG: <0.91 index (ref 0.00–0.90)
HSV 2 IgG, Type Spec: 8.59 index — ABNORMAL HIGH (ref 0.00–0.90)

## 2017-05-14 LAB — UPPER RESPIRATORY CULTURE, ROUTINE

## 2017-05-15 ENCOUNTER — Telehealth: Payer: Self-pay

## 2017-05-15 NOTE — Telephone Encounter (Signed)
-----   Message from Lizbeth BarkMandesia R Hairston, FNP sent at 05/14/2017  6:30 PM EDT ----- Upper respiratory culture negative for bacteria. 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 Herpes type 2 can also be contracted through oral sex. This test indicates you have been exposed to the herpes virus in the past.  Herpes cannot be cured. To decrease the risk of spreading of herpes use a condom every time you have sex, do not have sex when you have symptoms of an outbreak (blisters, sores, tingling). Medications are available to help manage outbreaks. Herpes type 1 which is primarily responsible for cold sores is negative.

## 2017-05-15 NOTE — Telephone Encounter (Signed)
CMA call regarding lab results   Patient did not answer but left a VM stating the reason of the call & to call back  

## 2017-05-22 ENCOUNTER — Other Ambulatory Visit: Payer: Self-pay | Admitting: *Deleted

## 2017-05-22 DIAGNOSIS — F901 Attention-deficit hyperactivity disorder, predominantly hyperactive type: Secondary | ICD-10-CM

## 2017-05-22 MED ORDER — ATOMOXETINE HCL 60 MG PO CAPS: 60.0000 mg | ORAL_CAPSULE | Freq: Every day | ORAL | 3 refills | Status: DC

## 2017-05-22 NOTE — Telephone Encounter (Signed)
PRINTED FOR PASS PROGRAM 

## 2017-06-08 ENCOUNTER — Telehealth: Payer: Self-pay | Admitting: *Deleted

## 2017-06-08 ENCOUNTER — Other Ambulatory Visit: Payer: Self-pay | Admitting: Family Medicine

## 2017-06-08 DIAGNOSIS — B009 Herpesviral infection, unspecified: Secondary | ICD-10-CM | POA: Insufficient documentation

## 2017-06-08 MED ORDER — ACYCLOVIR 400 MG PO TABS
400.0000 mg | ORAL_TABLET | Freq: Three times a day (TID) | ORAL | 11 refills | Status: AC
Start: 2017-06-08 — End: ?

## 2017-06-08 MED FILL — ?ACYCLOVIR 400MG TABLET: 400 | 5 days supply | Qty: 15 | Fill #0

## 2017-06-08 NOTE — Telephone Encounter (Signed)
Patient verified DOB Patient is aware of URI being negative/ Patient is aware Herpes 2 being present and needing to refrain from intercourse and oral sex during flare ups. Patient is aware of herpes 1 cold sores being negative. Patient is also aware of Strattera being approved but the shipment has not been received yet. Patient is aware of concern for medication being routed to PCP for approval and the patient will receive a follow up phone call.

## 2017-06-11 MED FILL — $STRATTERA 60 MG CAPSULE: 60 | 30 days supply | Qty: 30 | Fill #0

## 2017-06-28 ENCOUNTER — Telehealth: Payer: Self-pay | Admitting: Family Medicine

## 2017-06-28 NOTE — Telephone Encounter (Signed)
Pt called, needs to speak with a nurse or the provider about the side effects of his medication, (he is deciding to stop taking it until the provider or nurse gives him an update) Please follow up.

## 2017-07-02 NOTE — Telephone Encounter (Signed)
CMA call regarding medication side effect f/up   Patient did not answer burt left a VM stating the reason of the call & to call me back

## 2017-07-19 ENCOUNTER — Other Ambulatory Visit: Payer: Self-pay

## 2017-07-19 ENCOUNTER — Ambulatory Visit: Payer: Self-pay | Attending: Family Medicine | Admitting: Family Medicine

## 2017-07-19 ENCOUNTER — Encounter: Payer: Self-pay | Admitting: Family Medicine

## 2017-07-19 VITALS — BP 124/82 | HR 93 | Temp 98.5°F | Resp 18 | Ht 70.0 in | Wt 227.8 lb

## 2017-07-19 DIAGNOSIS — Z8659 Personal history of other mental and behavioral disorders: Secondary | ICD-10-CM

## 2017-07-19 DIAGNOSIS — R0789 Other chest pain: Secondary | ICD-10-CM | POA: Insufficient documentation

## 2017-07-19 DIAGNOSIS — F418 Other specified anxiety disorders: Secondary | ICD-10-CM | POA: Insufficient documentation

## 2017-07-19 DIAGNOSIS — F909 Attention-deficit hyperactivity disorder, unspecified type: Secondary | ICD-10-CM | POA: Insufficient documentation

## 2017-07-19 MED ORDER — PAROXETINE HCL 20 MG PO TABS: 20.0000 mg | ORAL_TABLET | Freq: Every day | ORAL | 2 refills | Status: DC

## 2017-07-19 NOTE — Patient Instructions (Addendum)

## 2017-07-19 NOTE — Progress Notes (Signed)
Patient is here for med side effects

## 2017-07-19 NOTE — Progress Notes (Signed)
Subjective:  Patient ID: Clifford Clay, male    DOB: [birth year]  Age: 31 y.o. MRN: [id number]  CC: Medication Reaction   HPI Clifford Clay presents medication side effects. He reports side effects with use of Strattera. Side effects included low libido, testicular aching, and difficulty urinating. He reports symptoms started first day he took medication and continued to take for 2 weeks before he discontinued. He reports symptoms stopped after that. Elevated PHQ9/GAD. Patient reports anxiousness and depressed mood over upcoming court case. He reports being charge with a felony and facing jail time.   He has the following symptoms: chest pain, insomnia. Onset of symptoms was approximately a few months ago, gradually worsening since that time. He reports last episode of chest pain was last night.  He denies current suicidal and homicidal ideation. Risk factors: none Previous treatment includes none. He declines speaking with LCSW today.   Outpatient Medications Prior to Visit  Medication Sig Dispense Refill  . acyclovir (ZOVIRAX) 400 MG tablet Take 1 tablet (400 mg total) by mouth 3 (three) times daily. For 5 days. 15 tablet 11  . amoxicillin (AMOXIL) 500 MG capsule Take 1 capsule (500 mg total) by mouth 3 (three) times daily. Open capsule and mix with applesauce or pudding 30 capsule 0  . benzonatate (TESSALON) 100 MG capsule Take 1 capsule (100 mg total) by mouth 2 (two) times daily as needed for cough. 20 capsule 0  . calamine lotion Apply 1 application topically as needed for itching. Apply to affected areas. Do not apply to genital areas. 177 mL 0  . colloidal oatmeal PACK Apply 1 each topically daily.    . diphenhydrAMINE (BENADRYL) 25 mg capsule Take 1 capsule (25 mg total) by mouth every 6 (six) hours as needed. 30 capsule 0  . Dyclonine HCl (SUCRETS SORE THROAT) 2 MG LOZG Use as directed 1 lozenge (2 mg total) in the mouth or throat as needed.  0  . famotidine (PEPCID) 20 MG tablet Take  1 tablet (20 mg total) by mouth daily. For 1 week. 7 tablet 0  . fluticasone (FLONASE) 50 MCG/ACT nasal spray Place 2 sprays into both nostrils daily. 16 g 0  . guaiFENesin-dextromethorphan (ROBITUSSIN DM) 100-10 MG/5ML syrup Take 5 mLs by mouth every 4 (four) hours as needed for cough. 236 mL 0  . phenol (CHLORASEPTIC MOUTH PAIN) 1.4 % LIQD Use as directed 1 spray in the mouth or throat as needed for throat irritation / pain.  0  . predniSONE (DELTASONE) 20 MG tablet Take 3 tablets (60mg ) x 1 week; Then 2 tablets (40 mg) x 1 week; Then 1 tablet (20 mg) x 1 week. 42 tablet 0  . atomoxetine (STRATTERA) 60 MG capsule Take 1 capsule (60 mg total) by mouth daily. 90 capsule 3   No facility-administered medications prior to visit.     ROS Review of Systems  Constitutional: Negative.   Respiratory: Negative.   Cardiovascular: Negative.   Psychiatric/Behavioral: Positive for dysphoric mood and sleep disturbance. Negative for suicidal ideas. The patient is nervous/anxious.       Objective:  BP 124/82 (BP Location: Left Arm, Patient Position: Sitting, Cuff Size: Normal)   Pulse 93   Temp 98.5 F (36.9 C) (Oral)   Resp 18   Ht 5\' 10"  (1.778 m)   Wt 227 lb 12.8 oz (103.3 kg)   SpO2 100%   BMI 32.69 kg/m   BP/Weight 07/19/2017 05/11/2017 12/08/2016  Systolic BP 124 133 126  Diastolic BP  82 83 87  Wt. (Lbs) 227.8 223 233.6  BMI 32.69 32 33.52     Physical Exam  Constitutional: He appears well-developed and well-nourished.  Cardiovascular: Normal rate, regular rhythm, normal heart sounds and intact distal pulses.  Pulmonary/Chest: Effort normal and breath sounds normal.  Psychiatric: His mood appears anxious. He expresses no homicidal and no suicidal ideation. He expresses no suicidal plans and no homicidal plans.  Nursing note and vitals reviewed.   Assessment & Plan:   1. Anxiety with depression Provided with counseling information handouts and LCSW card. - Ambulatory referral to  Psychiatry - PARoxetine (PAXIL) 20 MG tablet; Take 1 tablet (20 mg total) daily by mouth.  Dispense: 30 tablet; Refill: 2  2. Other chest pain Obtained EKG to r/o cardiac cause. - EKG 12-Lead  3. History of attention deficit hyperactivity disorder (ADHD) Referral for management of adult ADHD. - Ambulatory referral to Psychiatry     Follow-up: Return in about 4 weeks (around 08/16/2017), or if symptoms worsen or fail to improve, for Anxiety and Depression with [name].   Lizbeth BarkMandesia R Jahmier Willadsen FNP

## 2017-08-30 ENCOUNTER — Telehealth (HOSPITAL_COMMUNITY): Payer: Self-pay | Admitting: *Deleted

## 2017-08-30 NOTE — Telephone Encounter (Signed)
left voice message, provider not available on 09/06/17.  please call to reschedule your appointment.

## 2017-09-06 ENCOUNTER — Ambulatory Visit (HOSPITAL_COMMUNITY): Payer: Self-pay | Admitting: Psychiatry

## 2017-09-24 ENCOUNTER — Ambulatory Visit (HOSPITAL_COMMUNITY): Payer: Self-pay | Admitting: Psychiatry

## 2017-09-25 ENCOUNTER — Ambulatory Visit: Payer: Self-pay | Attending: Internal Medicine | Admitting: Internal Medicine

## 2017-09-25 ENCOUNTER — Encounter: Payer: Self-pay | Admitting: Internal Medicine

## 2017-09-25 VITALS — BP 131/90 | HR 61 | Temp 98.4°F | Resp 16 | Ht 70.5 in | Wt 232.8 lb

## 2017-09-25 DIAGNOSIS — A630 Anogenital (venereal) warts: Secondary | ICD-10-CM | POA: Insufficient documentation

## 2017-09-25 DIAGNOSIS — Z113 Encounter for screening for infections with a predominantly sexual mode of transmission: Secondary | ICD-10-CM | POA: Insufficient documentation

## 2017-09-25 DIAGNOSIS — F909 Attention-deficit hyperactivity disorder, unspecified type: Secondary | ICD-10-CM | POA: Insufficient documentation

## 2017-09-25 DIAGNOSIS — Z79899 Other long term (current) drug therapy: Secondary | ICD-10-CM | POA: Insufficient documentation

## 2017-09-25 MED ORDER — IMIQUIMOD 5 % EX CREA: TOPICAL_CREAM | CUTANEOUS | 0 refills | Status: AC

## 2017-09-25 MED ORDER — AZITHROMYCIN 250 MG PO TABS: 1000.0000 mg | ORAL_TABLET | Freq: Once | ORAL | 0 refills | Status: AC

## 2017-09-25 MED FILL — AZITHROMYCIN 250 MG TABLET: 250 | 1 days supply | Qty: 4 | Fill #0

## 2017-09-25 MED FILL — ?PAROXETINE HCL 20 MG TABLE: 20 | 30 days supply | Qty: 30 | Fill #0

## 2017-09-25 NOTE — Progress Notes (Signed)
Subjective:  Patient ID: Kevion Fatheree, male    DOB: February 20, 1986  Age: 32 y.o. MRN: [id number]  CC: std testing  HPI Ivaan Liddy is a 32 year-old male with a PMH significant for ADHD, Tobacco Use, and Herpes Type 2 that presents today for STD testing. In September 2018, he was positive for Herpes Simplex Type 2 and was treated with acyclovir.  One week ago, he received a call from a previous partner that stated she was positive for Chlamydia.   In the past 3 months he has had 2 sexual partners. The partner who called him was someone he had intercourse with about 1 month ago. He has not had intercourse with her since then. The second partner is unaware of the situation. He does not want to tell her until he has a diagnosis. Lorenza reports having unprotective intercourse. Reports having non-painful genital warts. Denies dysuria or penile discharge.   Past Medical History:  Diagnosis Date  . ADHD (attention deficit hyperactivity disorder) 1996    previously treated with straterra, concerta, aderall, ritalin. Off medications since 08/2011  . Tonsillitis 2014   chronic    No past surgical history on file. Allergies  Allergen Reactions  . Strattera [Atomoxetine Hcl] Other (See Comments)    low libido, testicular pain    Outpatient Medications Prior to Visit  Medication Sig Dispense Refill  . PARoxetine (PAXIL) 20 MG tablet Take 1 tablet (20 mg total) daily by mouth. 30 tablet 2  . acyclovir (ZOVIRAX) 400 MG tablet Take 1 tablet (400 mg total) by mouth 3 (three) times daily. For 5 days. (Patient not taking: Reported on 09/25/2017) 15 tablet 11  . amoxicillin (AMOXIL) 500 MG capsule Take 1 capsule (500 mg total) by mouth 3 (three) times daily. Open capsule and mix with applesauce or pudding (Patient not taking: Reported on 09/25/2017) 30 capsule 0  . benzonatate (TESSALON) 100 MG capsule Take 1 capsule (100 mg total) by mouth 2 (two) times daily as needed for cough. (Patient not taking:  Reported on 09/25/2017) 20 capsule 0  . calamine lotion Apply 1 application topically as needed for itching. Apply to affected areas. Do not apply to genital areas. (Patient not taking: Reported on 09/25/2017) 177 mL 0  . colloidal oatmeal PACK Apply 1 each topically daily. (Patient not taking: Reported on 09/25/2017)    . diphenhydrAMINE (BENADRYL) 25 mg capsule Take 1 capsule (25 mg total) by mouth every 6 (six) hours as needed. (Patient not taking: Reported on 09/25/2017) 30 capsule 0  . Dyclonine HCl (SUCRETS SORE THROAT) 2 MG LOZG Use as directed 1 lozenge (2 mg total) in the mouth or throat as needed. (Patient not taking: Reported on 09/25/2017)  0  . famotidine (PEPCID) 20 MG tablet Take 1 tablet (20 mg total) by mouth daily. For 1 week. (Patient not taking: Reported on 09/25/2017) 7 tablet 0  . fluticasone (FLONASE) 50 MCG/ACT nasal spray Place 2 sprays into both nostrils daily. (Patient not taking: Reported on 09/25/2017) 16 g 0  . guaiFENesin-dextromethorphan (ROBITUSSIN DM) 100-10 MG/5ML syrup Take 5 mLs by mouth every 4 (four) hours as needed for cough. (Patient not taking: Reported on 09/25/2017) 236 mL 0  . phenol (CHLORASEPTIC MOUTH PAIN) 1.4 % LIQD Use as directed 1 spray in the mouth or throat as needed for throat irritation / pain. (Patient not taking: Reported on 09/25/2017)  0  . predniSONE (DELTASONE) 20 MG tablet Take 3 tablets (60mg ) x 1 week; Then 2 tablets (40  mg) x 1 week; Then 1 tablet (20 mg) x 1 week. (Patient not taking: Reported on 09/25/2017) 42 tablet 0   No facility-administered medications prior to visit.     ROS Review of Systems  Constitutional: Negative for fever.  Genitourinary: Positive for genital sores. Negative for discharge, dysuria, penile pain and penile swelling.    Objective:  BP 131/90   Pulse 61   Temp 98.4 F (36.9 C) (Oral)   Resp 16   Ht 5' 10.5" (1.791 m)   Wt 232 lb 12.8 oz (105.6 kg)   SpO2 98%   BMI 32.93 kg/m   BP/Weight 09/25/2017  07/19/2017 05/11/2017  Systolic BP 131 124 133  Diastolic BP 90 82 83  Wt. (Lbs) 232.8 227.8 223  BMI 32.93 32.69 32    Physical Exam  Constitutional: He is oriented to person, place, and time. He appears well-developed and well-nourished.  Genitourinary: Testes normal. Circumcised. No penile tenderness. No discharge found.  Genitourinary Comments: Non-erythematous small and medium genital warts present on shaft of penis and pubis.   Musculoskeletal: Normal range of motion.  Neurological: He is alert and oriented to person, place, and time.  Skin: Skin is warm and dry.     Assessment & Plan:   1. Screen for sexually transmitted diseases Discussed use of condom and safe sex practices. - HIV antibody (with reflex) - RPR - Urine cytology ancillary only - azithromycin (ZITHROMAX) 250 MG tablet; Take 4 tablets (1,000 mg total) by mouth once for 1 dose.  Dispense: 4 tablet; Refill: 0  2. Genital warts - imiquimod (ALDARA) 5 % cream; Apply thin film 3 x per wk to affected area. Wash off in a.m.  Use for 16 wks  Dispense: 12 each; Refill: 0 - Ambulatory referral to Dermatology   Meds ordered this encounter  Medications  . imiquimod (ALDARA) 5 % cream    Sig: Apply thin film 3 x per wk to affected area. Wash off in a.m.  Use for 16 wks    Dispense:  12 each    Refill:  0  . azithromycin (ZITHROMAX) 250 MG tablet    Sig: Take 4 tablets (1,000 mg total) by mouth once for 1 dose.    Dispense:  4 tablet    Refill:  0    Follow-up: No Follow-up on file.

## 2017-09-25 NOTE — Progress Notes (Signed)
Pt is in the office today for STD testing  Pt symptoms are: -Gential warts around the shaft   Pt states his last partner was dx with Chlamydia

## 2017-09-25 NOTE — Patient Instructions (Addendum)
Use the Aldara cream as discussed.  If any increase redness or skin irritation from it stop it for about 1 week.    I encourage you to use condoms consistently.   Genital Warts Genital warts are a common STD (sexually transmitted disease). They may appear as small bumps on the tissues of the genital area or anal area. Sometimes, they can become irritated and cause pain. Genital warts are easily passed to other people through sexual contact. Getting treatment is important because genital warts can lead to other problems. In females, the virus that causes genital warts may increase the risk of cervical cancer. What are the causes? Genital warts are caused by a virus that is called human papillomavirus (HPV). HPV is spread by having unprotected sex with an infected person. It can be spread through vaginal, anal, and oral sex. Many people do not know that they are infected. They may be infected for years without problems. However, even if they do not have problems, they can pass the infection to their sexual partners. What increases the risk? Genital warts are more likely to develop in:  People who have unprotected sex.  People who have multiple sexual partners.  People who become sexually active before they are 30 years of age.  Men who are not circumcised.  Women who have a male sexual partner who is not circumcised.  People who have a weakened body defense system (immune system) due to disease or medicine.  People who smoke.  What are the signs or symptoms? Symptoms of genital warts include:  Small growths in the genital area or anal area. These warts often grow in clusters.  Itching and irritation in the genital area or anal area.  Bleeding from the warts.  Painful sexual intercourse.  How is this diagnosed? Genital warts can usually be diagnosed from their appearance on the vagina, vulva, penis, perineum, anus, or rectum. Tests may also be done, such as:  Biopsy. A tissue  sample is removed so it can be looked at under a microscope.  Colposcopy. In females, a magnifying tool is used to examine the vagina and cervix. Certain solutions may be used to make the HPV cells change color so they can be seen more easily.  A Pap test in females.  Tests for other STDs.  How is this treated? Treatment for genital warts may include:  Applying prescription medicines to the warts. These may be solutions or creams.  Freezing the warts with liquid nitrogen (cryotherapy).  Burning the warts with: ? Laser treatment. ? An electrified probe (electrocautery).  Injecting a substance (Candida antigen or Trichophyton antigen) into the warts to help the body's immune system to fight off the warts.  Interferon injections.  Surgery to remove the warts.  Follow these instructions at home: Medicines  Apply over-the-counter and prescription medicines only as told by your health care provider.  Do not treat genital warts with medicines that are used for treating hand warts.  Talk with your health care provider about using over-the-counter anti-itch creams. General instructions  Do not touch or scratch the warts.  Do not have sex until your treatment has been completed.  Tell your current and past sexual partners about your condition because they may also need treatment.  Keep all follow-up visits as told by your health care provider. This is important.  After treatment, use condoms during sex to prevent future infections. Other Instructions for Women  Women who have genital warts might need increased screening for cervical  cancer. This type of cancer is slow growing and can be cured if it is found early. Chances of developing cervical cancer are increased with HPV.  If you become pregnant, tell your health care provider that you have had HPV. Your health care provider will monitor you closely during pregnancy to be sure that your baby is safe. How is this  prevented? Talk with your health care provider about getting the HPV vaccines. These vaccines prevent some HPV infections and cancers. It is recommended that the vaccine be given to males and females who are 959-30 years of age. It will not work if you already have HPV, and it is not recommended for pregnant women. Contact a health care provider if:  You have redness, swelling, or pain in the area of the treated skin.  You have a fever.  You feel generally ill.  You feel lumps in and around your genital area or anal area.  You have bleeding in your genital area or anal area.  You have pain during sexual intercourse. This information is not intended to replace advice given to you by your health care provider. Make sure you discuss any questions you have with your health care provider. Document Released: 08/18/2000 Document Revised: 01/27/2016 Document Reviewed: 11/16/2014 Elsevier Interactive Patient Education  [employer]2018 [employer]

## 2017-09-26 LAB — HIV ANTIBODY (ROUTINE TESTING W REFLEX): HIV Screen 4th Generation wRfx: NONREACTIVE

## 2017-09-26 LAB — URINE CYTOLOGY ANCILLARY ONLY
Chlamydia: NEGATIVE
Neisseria Gonorrhea: NEGATIVE
Trichomonas: NEGATIVE

## 2017-09-26 LAB — RPR: RPR: NONREACTIVE

## 2017-09-28 NOTE — Progress Notes (Signed)
Psychiatric Initial Adult Assessment   Patient Identification: Clifford Clay MRN:  [id number] Date of Evaluation:  10/02/2017 Referral Source: Lizbeth Bark, FNP Chief Complaint:   Chief Complaint    Psychiatric Evaluation; ADHD     Visit Diagnosis:    ICD-10-CM   1. Attention deficit hyperactivity disorder (ADHD), unspecified ADHD type F90.9     History of Present Illness:   Clifford Clay is a 32 y.o. year old male with a history of depression, anxiety, ADHD per report, who is referred for ADHD and anxiety.   Patient states that he is here for evaluation of ADHD, recommended by his mother.  He states that he was diagnosed with ADD when he was at elementary school.  Although he tried medication, including Adderall up to high school,   He discontinued medication as he did not like to take medication.  He talks about an episode of his friend noticed his change in summer camp when he did not take medication.  He states that it has been difficult to keep up with life challenges lately.  He has not been able to keep track of bills. His insurance increased significantly due to speeding ticket, which he attributes to not be able to pay attention on traffic signs. He is disorganized and is easily distracted. He would do things without thinking consequences. He would be late for the meeting and misunderstands conversation. Although it has been affecting his work, he states that his boss is supportive. He is forgetful and forgets taking his medication. He tends to lose track of thoughts.   He has hypersomnia.  He endorses fatigue during the day.  He reports snoring at night. He feels depressed at times; once a month. He has good appetite.  He reports mild anhedonia at times.  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  He denies SI, HI, AH, VH. He tends to be worried about things and reports racing thoughts. He denies panic attacks. He drinks 4-6 oz of bourbon or whiskey only on weekends or  less. Although he initially denies drug use, he later states that he uses marijuana sporadically for recreational use (monthly).   Per PMP,  Not available. I have utilized the [place] Controlled Substances Reporting System (PMP AWARxE) to confirm adherence regarding the patient's medication. My review reveals appropriate prescription fills.   Associated Signs/Symptoms: Depression Symptoms:  depressed mood, fatigue, difficulty concentrating, anxiety, (Hypo) Manic Symptoms:  denies decrased for sleep. He has 15 mins of euphoria Anxiety Symptoms:  Excessive Worry, Psychotic Symptoms:  denies AH, VH, paranoia PTSD Symptoms: Negative  Past Psychiatric History:  Outpatient: diagnosed with ADHD when elementary school Psychiatry admission: denies Previous suicide attempt: denies   Past trials of medication: Paxil, Ritalin (appetite loss), Concerta (appetite loss), Strattera (appetite loss, testicle pain), Adderall History of violence: denies   Previous Psychotropic Medications: Yes   Substance Abuse History in the last 12 months:  No.  Consequences of Substance Abuse: NA  Past Medical History:  Past Medical History:  Diagnosis Date  . ADHD (attention deficit hyperactivity disorder) 1996    previously treated with straterra, concerta, aderall, ritalin. Off medications since 08/2011  . Tonsillitis 2014   chronic    No past surgical history on file.  Family Psychiatric History: denies  Family History:  Family History  Problem Relation Age of Onset  . Diabetes Mother   . Heart disease Father 72       heart attack   . Alcohol abuse Father   .  Cancer Neg Hx     Social History:   Social History   Socioeconomic History  . Marital status: Single    Spouse name: None  . Number of children: None  . Years of education: 53  . Highest education level: None  Social Needs  . Financial resource strain: None  . Food insecurity - worry: None  . Food insecurity - inability: None  .  Transportation needs - medical: None  . Transportation needs - non-medical: None  Occupational History  . Occupation: Unemployed   Tobacco Use  . Smoking status: Former Smoker    Packs/day: 0.10    Types: Cigarettes, Cigars  . Smokeless tobacco: Never Used  Substance and Sexual Activity  . Alcohol use: Yes    Alcohol/week: 0.0 oz    Comment: rare  . Drug use: Yes    Types: Marijuana    Comment: last used Oct 05 2014  . Sexual activity: Yes    Birth control/protection: Condom  Other Topics Concern  . None  Social History Narrative   Lives alone at her grandmother's house.   Grandmother currently in [place] [facility] since having a fall in 2014.     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     Additional Social History:  Education: high school, he was briefly in special class He was born and grew up in [place], spent time alone most of the time in childhood.  He reports father is "not personable." He reports good relationship with his mother. 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 Strattera [Atomoxetine Hcl] Other (See Comments)    low libido, testicular pain    Metabolic Disorder Labs: Lab Results  Component Value Date   HGBA1C 4.8 06/05/2016   No results found for: PROLACTIN Lab Results  Component Value Date   CHOL 218 (H) 06/05/2016   TRIG 214 (H) 06/05/2016   HDL 39 (L) 06/05/2016   CHOLHDL 5.6 (H) 06/05/2016   LDLCALC 136 (H) 06/05/2016     Current Medications: Current Outpatient Medications  Medication Sig Dispense Refill  . acyclovir (ZOVIRAX) 400 MG tablet Take 1 tablet (400 mg total) by mouth 3 (three) times daily. For 5 days. 15 tablet 11  . amoxicillin (AMOXIL) 500 MG capsule Take 1 capsule (500 mg total) by mouth 3 (three) times daily. Open capsule and mix with applesauce or pudding 30 capsule 0  . benzonatate (TESSALON) 100 MG capsule Take 1 capsule (100 mg total) by mouth 2  (two) times daily as needed for cough. 20 capsule 0  . calamine lotion Apply 1 application topically as needed for itching. Apply to affected areas. Do not apply to genital areas. 177 mL 0  . colloidal oatmeal PACK Apply 1 each topically daily.    . diphenhydrAMINE (BENADRYL) 25 mg capsule Take 1 capsule (25 mg total) by mouth every 6 (six) hours as needed. 30 capsule 0  . Dyclonine HCl (SUCRETS SORE THROAT) 2 MG LOZG Use as directed 1 lozenge (2 mg total) in the mouth or throat as needed.  0  . famotidine (PEPCID) 20 MG tablet Take 1 tablet (20 mg total) by mouth daily. For 1 week. 7 tablet 0  . fluticasone (FLONASE) 50 MCG/ACT nasal spray Place 2 sprays into both nostrils daily. 16 g 0  . guaiFENesin-dextromethorphan (ROBITUSSIN DM) 100-10 MG/5ML syrup Take 5 mLs by mouth every 4 (four) hours as  needed for cough. 236 mL 0  . imiquimod (ALDARA) 5 % cream Apply thin film 3 x per wk to affected area. Wash off in a.m.  Use for 16 wks 12 each 0  . phenol (CHLORASEPTIC MOUTH PAIN) 1.4 % LIQD Use as directed 1 spray in the mouth or throat as needed for throat irritation / pain.  0  . predniSONE (DELTASONE) 20 MG tablet Take 3 tablets (60mg ) x 1 week; Then 2 tablets (40 mg) x 1 week; Then 1 tablet (20 mg) x 1 week. 42 tablet 0  . lisdexamfetamine (VYVANSE) 30 MG capsule Take 1 capsule (30 mg total) by mouth daily. 30 capsule 0   No current facility-administered medications for this visit.     Neurologic: Headache: No Seizure: No Paresthesias:No  Musculoskeletal: Strength & Muscle Tone: within normal limits Gait & Station: normal Patient leans: N/A  Psychiatric Specialty Exam: Review of Systems  Constitutional: Positive for malaise/fatigue.  Psychiatric/Behavioral: Positive for depression. Negative for hallucinations, memory loss, substance abuse and suicidal ideas. The patient is nervous/anxious and has insomnia.   All other systems reviewed and are negative.   Blood pressure 126/83, pulse  62, height 5' 10.5" (1.791 m), weight 234 lb (106.1 kg), SpO2 97 %.Body mass index is 33.1 kg/m.  General Appearance: Fairly Groomed  Eye Contact:  Good  Speech:  Clear and Coherent  Volume:  Normal  Mood:  "tired"  Affect:  Appropriate, Congruent, Full Range and calm  Thought Process:  Coherent and Goal Directed  Orientation:  Full (Time, Place, and Person)  Thought Content:  Logical  Suicidal Thoughts:  No  Homicidal Thoughts:  No  Memory:  Immediate;   Good Recent;   Good Remote;   Good  Judgement:  Good  Insight:  Fair  Psychomotor Activity:  Normal  Concentration:  Concentration: Fair and Attention Span: Fair occasionally needs to be questioned repeatedly  Recall:  Good  Fund of Knowledge:Good  Language: Good  Akathisia:  No  Handed:  Right  AIMS (if indicated):  N/A  Assets:  Communication Skills Desire for Improvement  ADL's:  Intact  Cognition: WNL  Sleep:  Poor, hypersomnia   Assessment Clifford Clay is a 32 y.o. year old male with a history of depression, anxiety, ADHD per report, who is referred for ADHD and anxiety.   # ADHD Exam is notable for occasional distraction and patient needs to be repeated questions at times. He reports childhood onset ADHD and reports symptoms consistent with ADHD. Although he does report mild depression/anxiety, his symptoms are predominantly ADHD, which appears to be impacting on his mood. Will start Vyvanse to target ADHD.  Discussed side effect which includes appetite loss, worsening anxiety, hypertension and headache.   # Unspecified anxiety disorder # Adjustment disorder with depressed mood  Patient inconsistently takes Paxil (the last dose one week ago). Will hold this medication at this time to see if his mood improves as his ADHD symptoms improve. Will consider fluoxetine (longer acting) in the future if he has residual symptoms.    # Marijuana use  Patient reports sporadic marijuana use. Will obtain urine drug test. Patient  understands that stimulant will not be prescribed if he has ongoing substance use.   # r/o sleep apnea Patient has some features of sleep apnea. He is advised to see his PCP.  Plan 1. Start Vyvanse 30 mg daily  2. Discontinue Paxil (he has not taken this medication consistently) 3. Talk to your primary care about possible  sleep apnea 4. Return to clinic in one month for 30 mins 5. Obtain urine drug test  The patient demonstrates the following risk factors for suicide: Chronic risk factors for suicide include: psychiatric disorder of anxiety. Acute risk factors for suicide include: N/A. Protective factors for this patient include: responsibility to others (children, family), coping skills and hope for the future. Considering these factors, the overall suicide risk at this point appears to be low. Patient is appropriate for outpatient follow up.   Treatment Plan Summary: Plan as above   Neysa Hottereina Roisin Mones, MD 1/29/20198:54 AM

## 2017-10-01 ENCOUNTER — Telehealth: Payer: Self-pay

## 2017-10-01 NOTE — Telephone Encounter (Signed)
Contacted pt to go over lab results pt didn't answer lvm asking pt to give me a call at his earliest convenience  If pt calls back please give results: HIV, syphilis screen, and test for Trichomonas, GC/Chlamydia are all negative.

## 2017-10-02 ENCOUNTER — Other Ambulatory Visit (HOSPITAL_COMMUNITY): Payer: Self-pay | Admitting: Psychiatry

## 2017-10-02 ENCOUNTER — Telehealth (HOSPITAL_COMMUNITY): Payer: Self-pay | Admitting: *Deleted

## 2017-10-02 ENCOUNTER — Ambulatory Visit (INDEPENDENT_AMBULATORY_CARE_PROVIDER_SITE_OTHER): Payer: No Typology Code available for payment source | Admitting: Psychiatry

## 2017-10-02 ENCOUNTER — Encounter (HOSPITAL_COMMUNITY): Payer: Self-pay | Admitting: Psychiatry

## 2017-10-02 ENCOUNTER — Encounter: Payer: Self-pay | Admitting: Psychiatry

## 2017-10-02 VITALS — BP 126/83 | HR 62 | Ht 70.5 in | Wt 234.0 lb

## 2017-10-02 DIAGNOSIS — Z811 Family history of alcohol abuse and dependence: Secondary | ICD-10-CM

## 2017-10-02 DIAGNOSIS — F909 Attention-deficit hyperactivity disorder, unspecified type: Secondary | ICD-10-CM

## 2017-10-02 DIAGNOSIS — F419 Anxiety disorder, unspecified: Secondary | ICD-10-CM | POA: Insufficient documentation

## 2017-10-02 DIAGNOSIS — Z87891 Personal history of nicotine dependence: Secondary | ICD-10-CM

## 2017-10-02 DIAGNOSIS — G4710 Hypersomnia, unspecified: Secondary | ICD-10-CM

## 2017-10-02 DIAGNOSIS — G4700 Insomnia, unspecified: Secondary | ICD-10-CM

## 2017-10-02 DIAGNOSIS — R450 Nervousness: Secondary | ICD-10-CM

## 2017-10-02 DIAGNOSIS — F1211 Cannabis abuse, in remission: Secondary | ICD-10-CM

## 2017-10-02 MED ORDER — LISDEXAMFETAMINE DIMESYLATE 30 MG PO CAPS: 30.0000 mg | ORAL_CAPSULE | Freq: Every day | ORAL | 0 refills | Status: AC

## 2017-10-02 MED ORDER — LISDEXAMFETAMINE DIMESYLATE 30 MG PO CAPS: 30.0000 mg | ORAL_CAPSULE | Freq: Every day | ORAL | 0 refills | Status: DC

## 2017-10-02 NOTE — Patient Instructions (Signed)
1. Start Vyvanse 30 mg daily  2. Discontinue Paxil (he has not taken this medication consistently) 3. Talk to your primary care about possible sleep apnea 4. Return to clinic in one month for 30 mins 5. Obtain urine drug test

## 2017-10-02 NOTE — Telephone Encounter (Signed)
ordered

## 2017-10-02 NOTE — Telephone Encounter (Signed)
Dr Vanetta [facility]l[place]ada  [facility] called they do not fill controlled substances @ this location. Patient suggested [facility] for RX for Vyvanse 30mg . Pharmacy updated in sysyem

## 2017-10-08 ENCOUNTER — Telehealth (HOSPITAL_COMMUNITY): Payer: Self-pay | Admitting: Psychiatry

## 2017-10-08 NOTE — Telephone Encounter (Signed)
Reviewed [facility]  Positive for marijuana.

## 2017-10-30 NOTE — Progress Notes (Deleted)
BH MD/PA/NP OP Progress Note  10/30/2017 1:45 PM Clifford Clay  MRN:  [id number]  Chief Complaint:  HPI:   Marijuana, sleep apnea  Per PMP,  Data not available  Visit Diagnosis: No diagnosis found.  Past Psychiatric History:  I have reviewed the patient's psychiatry history in detail and updated the patient record. Outpatient: diagnosed with ADHD when elementary school Psychiatry admission: denies Previous suicide attempt: denies   Past trials of medication: Paxil, Ritalin (appetite loss), Concerta (appetite loss), Strattera (appetite loss, testicle pain), Adderall History of violence: denies     Past Medical History:  Past Medical History:  Diagnosis Date  . ADHD (attention deficit hyperactivity disorder) 1996    previously treated with straterra, concerta, aderall, ritalin. Off medications since 08/2011  . Tonsillitis 2014   chronic    No past surgical history on file.  Family Psychiatric History: I have reviewed the patient's family history in detail and updated the patient record.  Family History:  Family History  Problem Relation Age of Onset  . Diabetes Mother   . Heart disease Father 246       heart attack   . Alcohol abuse Father   . Cancer Neg Hx     Social History:  Social History   Socioeconomic History  . Marital status: Single    Spouse name: Not on file  . Number of children: Not on file  . Years of education: 2212  . Highest education level: Not on file  Social Needs  . Financial resource strain: Not on file  . Food insecurity - worry: Not on file  . Food insecurity - inability: Not on file  . Transportation needs - medical: Not on file  . Transportation needs - non-medical: Not on file  Occupational History  . Occupation: Unemployed   Tobacco Use  . Smoking status: Former Smoker    Packs/day: 0.10    Types: Cigarettes, Cigars  . Smokeless tobacco: Never Used  Substance and Sexual Activity  . Alcohol use: Yes    Alcohol/week: 0.0 oz    Comment: rare  . Drug use: Yes    Types: Marijuana    Comment: last used Oct 05 2014  . Sexual activity: Yes    Birth control/protection: Condom  Other Topics Concern  . Not on file  Social History Narrative   Lives alone at her grandmother's house.   Grandmother currently in [place] [facility] since having a fall in 2014.     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     Allergies:  Allergies  Allergen Reactions  . Strattera [Atomoxetine Hcl] Other (See Comments)    low libido, testicular pain    Metabolic Disorder Labs: Lab Results  Component Value Date   HGBA1C 4.8 06/05/2016   No results found for: PROLACTIN Lab Results  Component Value Date   CHOL 218 (H) 06/05/2016   TRIG 214 (H) 06/05/2016   HDL 39 (L) 06/05/2016   CHOLHDL 5.6 (H) 06/05/2016   LDLCALC 136 (H) 06/05/2016   Lab Results  Component Value Date   TSH 2.390 06/05/2016    Therapeutic Level Labs: No results found for: LITHIUM No results found for: VALPROATE No components found for:  CBMZ  Current Medications: Current Outpatient Medications  Medication Sig Dispense Refill  . acyclovir (ZOVIRAX) 400 MG tablet Take 1 tablet (400 mg total) by mouth 3 (three) times daily. For 5 days. 15 tablet 11  . amoxicillin (AMOXIL) 500 MG capsule Take 1 capsule (  500 mg total) by mouth 3 (three) times daily. Open capsule and mix with applesauce or pudding 30 capsule 0  . benzonatate (TESSALON) 100 MG capsule Take 1 capsule (100 mg total) by mouth 2 (two) times daily as needed for cough. 20 capsule 0  . calamine lotion Apply 1 application topically as needed for itching. Apply to affected areas. Do not apply to genital areas. 177 mL 0  . colloidal oatmeal PACK Apply 1 each topically daily.    . diphenhydrAMINE (BENADRYL) 25 mg capsule Take 1 capsule (25 mg total) by mouth every 6 (six) hours as needed. 30 capsule 0  . Dyclonine HCl (SUCRETS SORE THROAT) 2 MG LOZG Use as directed 1 lozenge (2 mg total) in the mouth or throat as  needed.  0  . famotidine (PEPCID) 20 MG tablet Take 1 tablet (20 mg total) by mouth daily. For 1 week. 7 tablet 0  . fluticasone (FLONASE) 50 MCG/ACT nasal spray Place 2 sprays into both nostrils daily. 16 g 0  . guaiFENesin-dextromethorphan (ROBITUSSIN DM) 100-10 MG/5ML syrup Take 5 mLs by mouth every 4 (four) hours as needed for cough. 236 mL 0  . imiquimod (ALDARA) 5 % cream Apply thin film 3 x per wk to affected area. Wash off in a.m.  Use for 16 wks 12 each 0  . lisdexamfetamine (VYVANSE) 30 MG capsule Take 1 capsule (30 mg total) by mouth daily. 30 capsule 0  . phenol (CHLORASEPTIC MOUTH PAIN) 1.4 % LIQD Use as directed 1 spray in the mouth or throat as needed for throat irritation / pain.  0  . predniSONE (DELTASONE) 20 MG tablet Take 3 tablets (60mg ) x 1 week; Then 2 tablets (40 mg) x 1 week; Then 1 tablet (20 mg) x 1 week. 42 tablet 0   No current facility-administered medications for this visit.      Musculoskeletal: Strength & Muscle Tone: within normal limits Gait & Station: normal Patient leans: N/A  Psychiatric Specialty Exam: ROS  There were no vitals taken for this visit.There is no height or weight on file to calculate BMI.  General Appearance: Fairly Groomed  Eye Contact:  Good  Speech:  Clear and Coherent  Volume:  Normal  Mood:  {BHH MOOD:22306}  Affect:  {Affect (PAA):22687}  Thought Process:  Coherent and Goal Directed  Orientation:  Full (Time, Place, and Person)  Thought Content: Logical   Suicidal Thoughts:  {ST/HT (PAA):22692}  Homicidal Thoughts:  {ST/HT (PAA):22692}  Memory:  Immediate;   Good Recent;   Good Remote;   Good  Judgement:  {Judgement (PAA):22694}  Insight:  {Insight (PAA):22695}  Psychomotor Activity:  Normal  Concentration:  Concentration: Good and Attention Span: Good  Recall:  Good  Fund of Knowledge: Good  Language: Good  Akathisia:  No  Handed:  Right  AIMS (if indicated): not done  Assets:  Communication Skills Desire for  Improvement  ADL's:  Intact  Cognition: WNL  Sleep:  {BHH GOOD/FAIR/POOR:22877}   Screenings: GAD-7     [facility] Visit from 09/25/2017 in [facility] Office Visit from 07/19/2017 in [facility] Office Visit from 05/11/2017 in [facility] Office Visit from 11/18/2015 in [facility]  Total GAD-7 Score  11  17  9  2     PHQ2-9     Office Visit from 09/25/2017 in [facility] Visit from 07/19/2017 in [facility] Office Visit from 05/11/2017 in [facility] Office Visit from 11/18/2015 in [facility] Office Visit from 09/07/2015 in [facility]  PHQ-2 Total Score  4  1  4  2   0  PHQ-9 Total Score  14  11  17  9   No data       Assessment and Plan:  Clifford Clay is a 32 y.o. year old male with a history of ADHD, depression, anxiety , who presents for follow up appointment for No diagnosis found.   # ADHD  Exam is notable for occasional distraction and patient needs to be repeated questions at times. He reports childhood onset ADHD and reports symptoms consistent with ADHD. Although he does report mild depression/anxiety, his symptoms are predominantly ADHD, which appears to be impacting on his mood. Will start Vyvanse to target ADHD.  Discussed side effect which includes appetite loss, worsening anxiety, hypertension and headache.   # Unspecified anxiety disorder # Adjustment disorder with depressed mood  Patient inconsistently takes Paxil (the last dose one week ago). Will hold this medication at this time to see if his mood improves as his ADHD symptoms improve. Will consider fluoxetine (longer acting) in the future if he has residual symptoms.    # Marijuana use Patient reports sporadic marijuana use. Will obtain urine drug  test. Patient understands that stimulant will not be prescribed if he has ongoing substance use.   # r/o sleep apnea Patient has some features of sleep apnea. He is advised to see his PCP.  Plan 1. Start Vyvanse 30 mg daily  2. Discontinue Paxil (he has not taken this medication consistently) 3. Talk to your primary care about possible sleep apnea 4. Return to clinic in one month for 30 mins 5. Obtain urine drug test  The patient demonstrates the following risk factors for suicide: Chronic risk factors for suicide include: psychiatric disorder of anxiety. Acute risk factors for suicide include: N/A. Protective factors for this patient include: responsibility to others (children, family), coping skills and hope for the future. Considering these factors, the overall suicide risk at this point appears to be low. Patient is appropriate for outpatient follow up.   Neysa Hotter, MD 10/30/2017, 1:45 PM

## 2017-11-01 ENCOUNTER — Ambulatory Visit (HOSPITAL_COMMUNITY): Payer: Self-pay | Admitting: Psychiatry

## 2018-09-17 ENCOUNTER — Ambulatory Visit: Payer: Self-pay | Admitting: Internal Medicine

## 2020-01-31 ENCOUNTER — Encounter: Payer: Self-pay | Admitting: Internal Medicine
